# Patient Record
Sex: Male | Born: 1985 | Race: White | Hispanic: No | Marital: Married | Smoking: Never smoker
Health system: Southern US, Community
[De-identification: ages and names within clinical notes are randomized; demographics above are authoritative.]

## PROBLEM LIST (undated history)

## (undated) DIAGNOSIS — B019 Varicella without complication: Secondary | ICD-10-CM

## (undated) HISTORY — DX: Varicella without complication: B01.9

---
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
## 2006-03-30 ENCOUNTER — Emergency Department (HOSPITAL_COMMUNITY): Admission: EM | Admit: 2006-03-30 | Discharge: 2006-03-30 | Payer: Self-pay | Admitting: Emergency Medicine

## 2006-09-06 ENCOUNTER — Emergency Department (HOSPITAL_COMMUNITY): Admission: EM | Admit: 2006-09-06 | Discharge: 2006-09-06 | Payer: Self-pay | Admitting: Emergency Medicine

## 2007-09-13 ENCOUNTER — Emergency Department (HOSPITAL_COMMUNITY): Admission: EM | Admit: 2007-09-13 | Discharge: 2007-09-13 | Payer: Self-pay | Admitting: Emergency Medicine

## 2008-02-11 ENCOUNTER — Emergency Department (HOSPITAL_COMMUNITY): Admission: EM | Admit: 2008-02-11 | Discharge: 2008-02-11 | Payer: Self-pay | Admitting: Emergency Medicine

## 2009-11-25 IMAGING — CR DG ANKLE COMPLETE 3+V*L*
3 series · 3 of 3 positions shown · non-contrast
Comparison: No priors

CLINICAL DATA: Twisted ankle - pain and swelling

LEFT ANKLE COMPLETE - 3+ VIEW

[t ankle joint ap left]
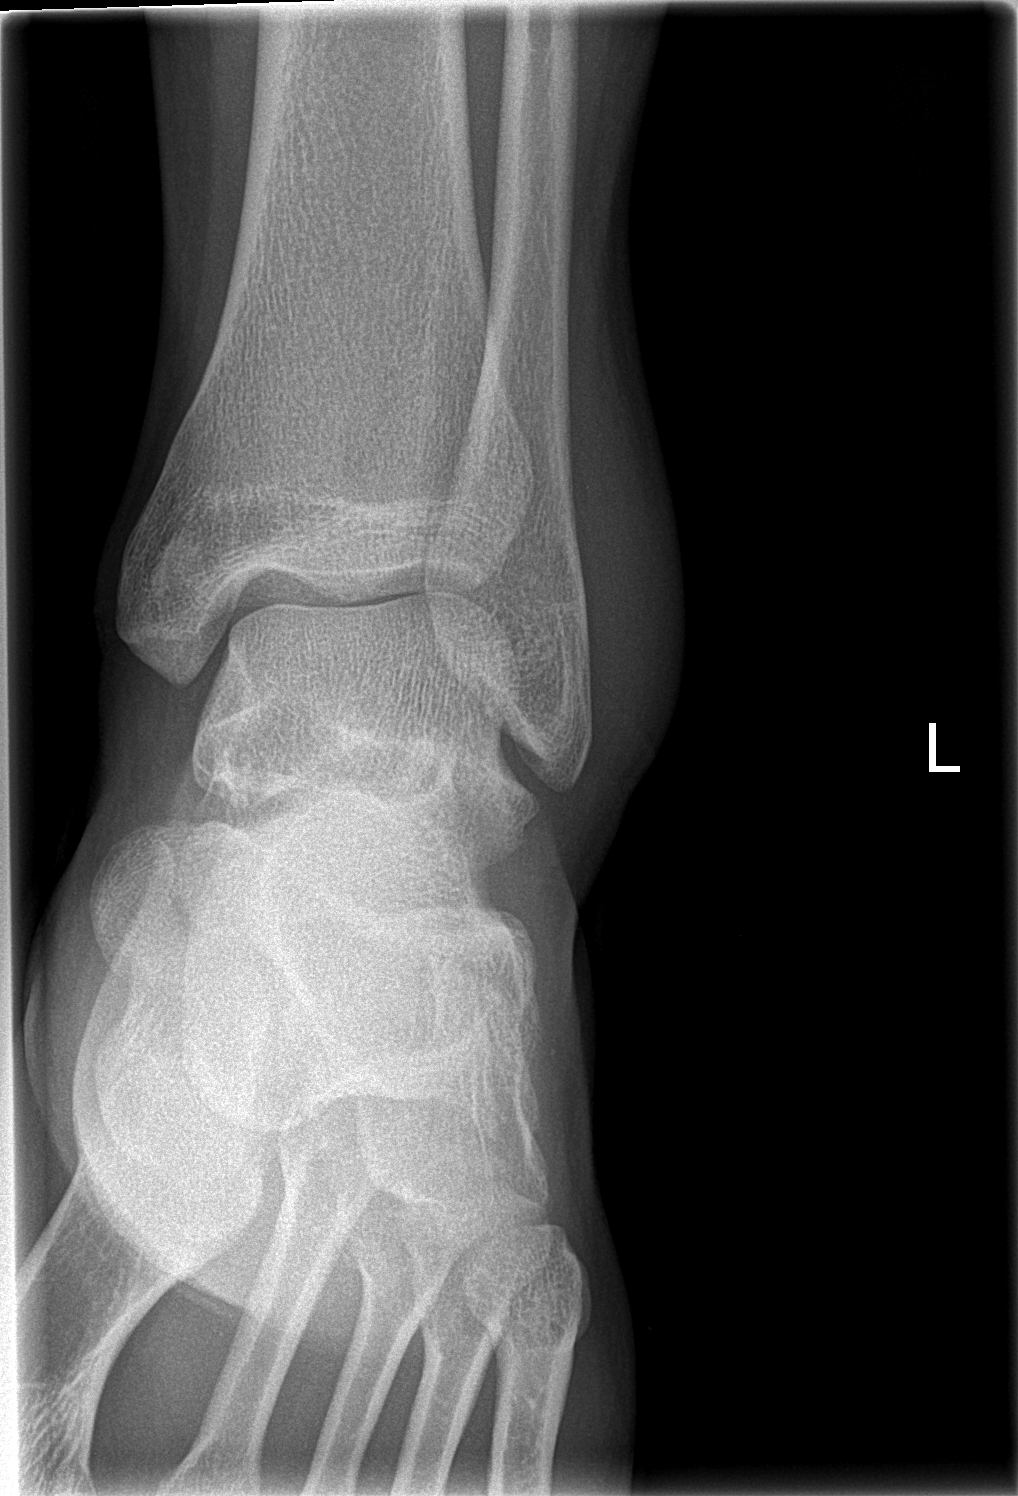

[t ankle joint oblique left]
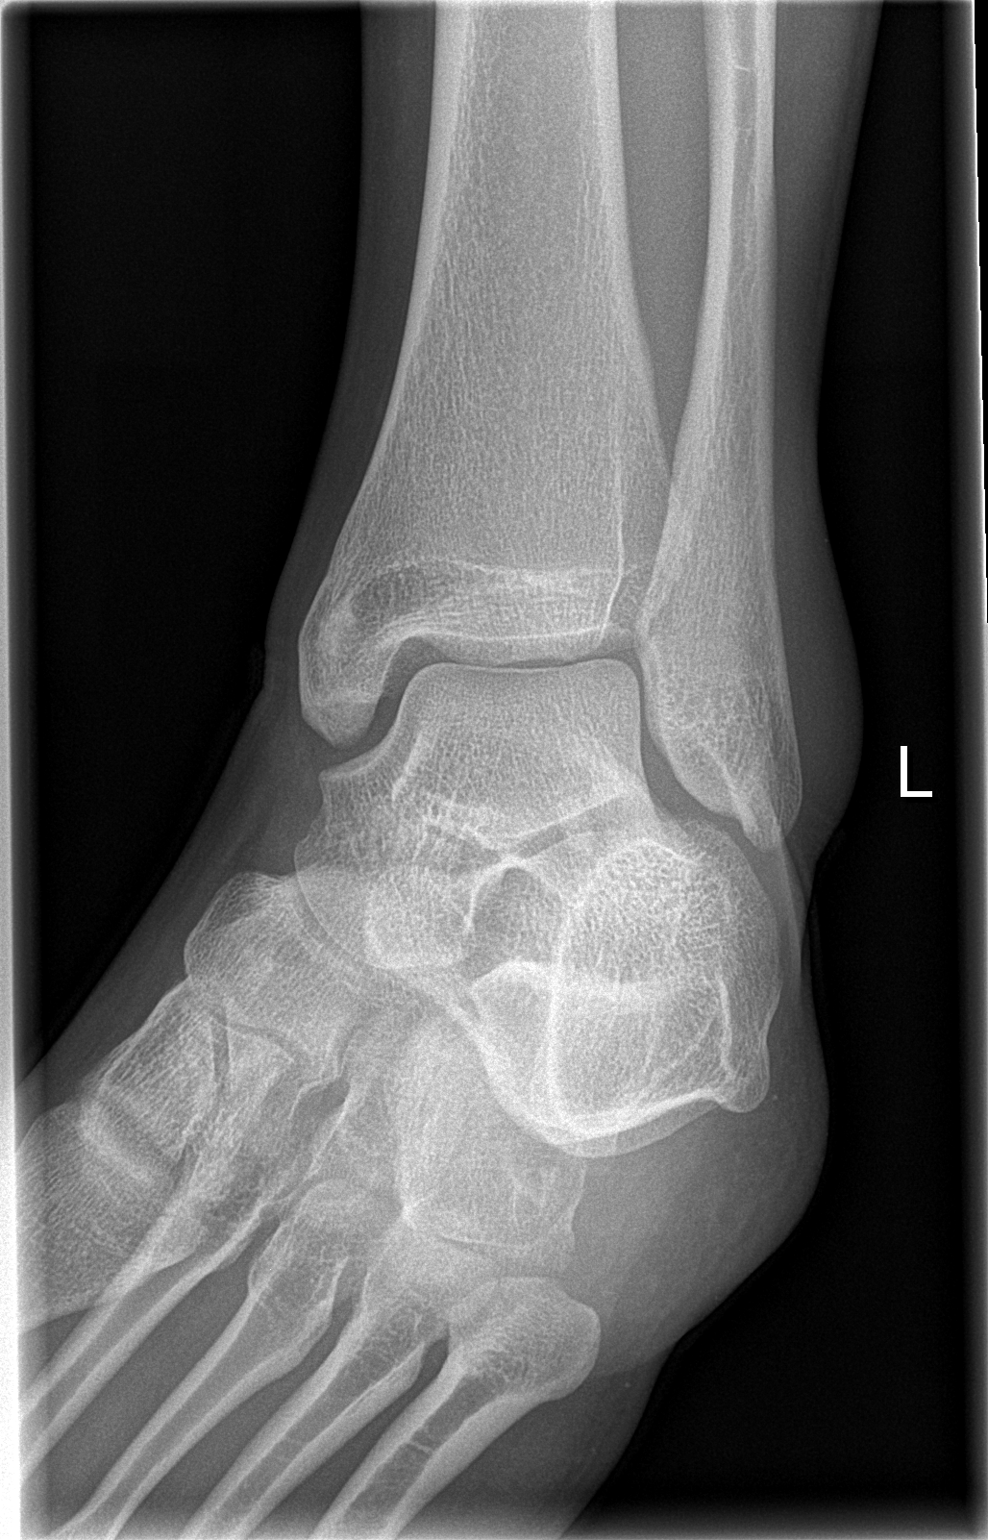

[t ankle joint lat left]
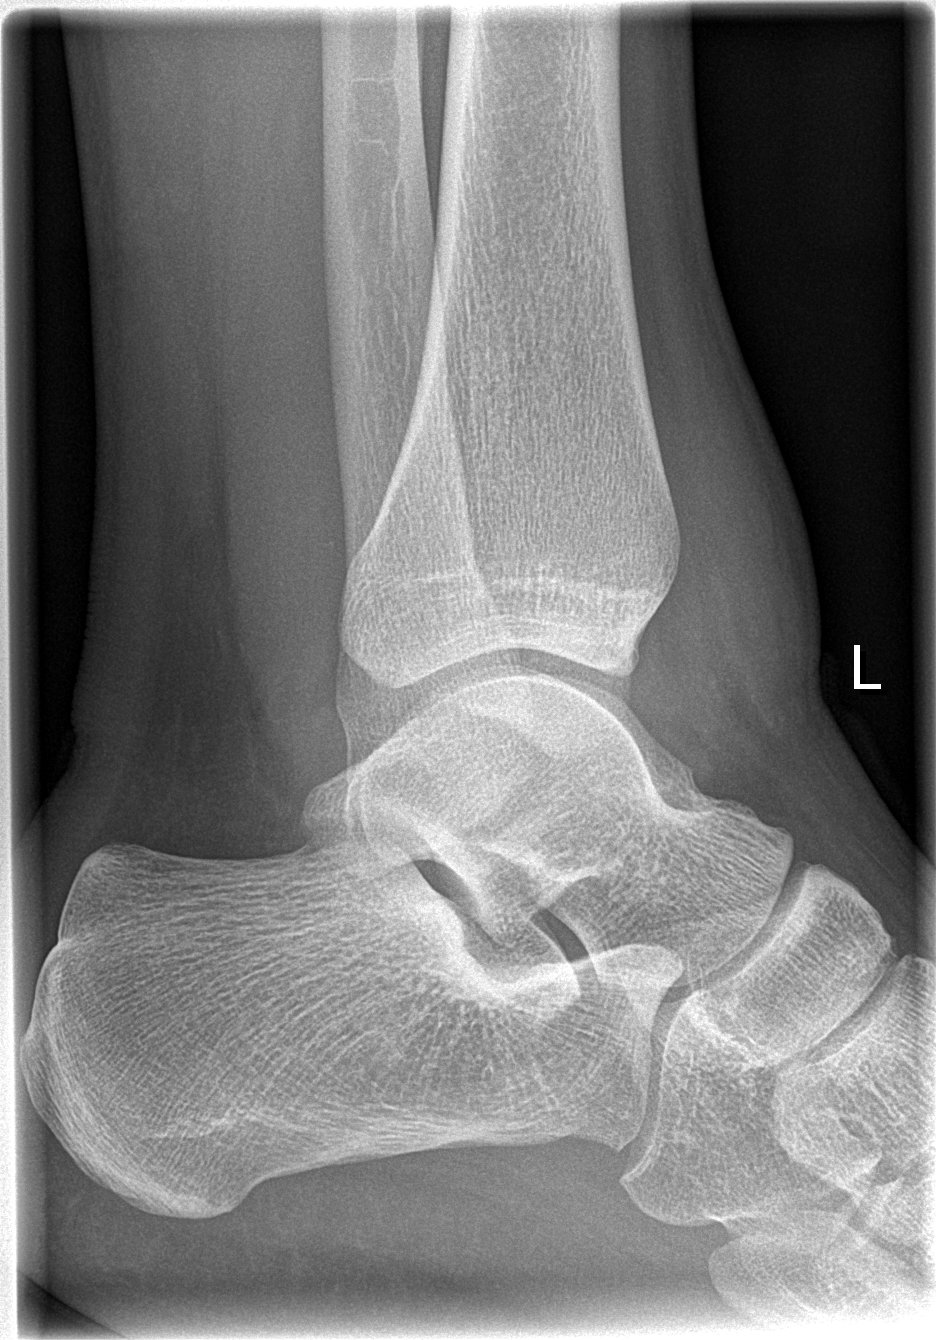

[3 of 3 positions shown; findings below may reference images not displayed]

FINDINGS: There is considerable swelling laterally and anteriorly.
There is no visible fracture or dislocation.
IMPRESSION: Soft tissue swelling antero- laterally.  No fracture or
dislocation.

## 2010-04-17 ENCOUNTER — Emergency Department (HOSPITAL_COMMUNITY): Admission: EM | Admit: 2010-04-17 | Discharge: 2010-04-17 | Payer: Self-pay | Admitting: Emergency Medicine

## 2017-07-17 ENCOUNTER — Ambulatory Visit (INDEPENDENT_AMBULATORY_CARE_PROVIDER_SITE_OTHER): Payer: BLUE CROSS/BLUE SHIELD | Admitting: Family Medicine

## 2017-07-17 ENCOUNTER — Encounter: Payer: Self-pay | Admitting: Family Medicine

## 2017-07-17 DIAGNOSIS — M25562 Pain in left knee: Secondary | ICD-10-CM | POA: Diagnosis not present

## 2017-07-17 DIAGNOSIS — G8929 Other chronic pain: Secondary | ICD-10-CM | POA: Diagnosis not present

## 2017-07-17 NOTE — Assessment & Plan Note (Signed)
Likely secondary to degenerative changes with possible meniscal tear.  His exam today is relatively stable.  Advised continued conservative management.  Recommended compression, ice, and OTC NSAIDs for flareups.  Discussed home exercise program. Pt deferred plain films today. Return precautions reviewed. Follow up as needed.

## 2017-07-17 NOTE — Progress Notes (Signed)
   Subjective:  Steven SkainsBrian M Vaughan is a 32 y.o. male who presents today with a chief complaint of left knee pain and to establish care.   HPI:  Left Knee Pain, New Problem Started about a year ago.  Pain is intermittent nature.  Associated with swelling.  Episodes will occur randomly and last for a few days to week and then subside.  During the episodes, patient uses ice and ibuprofen which helps significant only.  Does not have any clear precipitating events.  His knee will frequently pop during and after the episodes.  He played basketball while in high school and severely injured his left ankle.  Denies any serious injury to his left knee.  No weakness or numbness.  No fevers or chills.  ROS: Per HPI, otherwise a complete review of systems was negative.   PMH:  The following were reviewed and entered/updated in epic: Past Medical History:  Diagnosis Date  . Chicken pox    Patient Active Problem List   Diagnosis Date Noted  . Left knee pain 07/17/2017   History reviewed. No pertinent surgical history.  Family History  Problem Relation Age of Onset  . Gout Neg Hx     Medications- reviewed and updated No current outpatient medications on file.   No current facility-administered medications for this visit.    Allergies-reviewed and updated No Known Allergies  Social History   Socioeconomic History  . Marital status: Married    Spouse name: None  . Number of children: 3  . Years of education: None  . Highest education level: None  Social Needs  . Financial resource strain: None  . Food insecurity - worry: None  . Food insecurity - inability: None  . Transportation needs - medical: None  . Transportation needs - non-medical: None  Occupational History  . None  Tobacco Use  . Smoking status: Never Smoker  . Smokeless tobacco: Never Used  Substance and Sexual Activity  . Alcohol use: No    Frequency: Never  . Drug use: No  . Sexual activity: Yes    Partners:  Female  Other Topics Concern  . None  Social History Narrative  . None   Objective:  Physical Exam: BP 124/82 (BP Location: Left Arm, Patient Position: Sitting, Cuff Size: Normal)   Pulse 79   Temp 98.3 F (36.8 C) (Oral)   Ht 5\' 7"  (1.702 m)   Wt 196 lb 6.4 oz (89.1 kg)   SpO2 97%   BMI 30.76 kg/m   Gen: NAD, resting comfortably CV: RRR with no murmurs appreciated Pulm: NWOB, CTAB with no crackles, wheezes, or rhonchi GI: Normal bowel sounds present. Soft, Nontender, Nondistended. MSK: -Left knee: No deformities.  Nontender to palpation.  Full range of motion.  Stable to varus and valgus stress.  Anterior and posterior drawer signs negative.  Palpable click with McMurray test.  Neurovascularly intact distally. Skin: Warm, dry Neuro: Grossly normal, moves all extremities Psych: Normal affect and thought content  Assessment/Plan:  Left knee pain Likely secondary to degenerative changes with possible meniscal tear.  His exam today is relatively stable.  Advised continued conservative management.  Recommended compression, ice, and OTC NSAIDs for flareups.  Discussed home exercise program. Pt deferred plain films today. Return precautions reviewed. Follow up as needed.   Preventative Healthcare Pt will return soon for CPE.   Katina Degreealeb M. Jimmey RalphParker, MD 07/17/2017 10:00 AM

## 2017-07-17 NOTE — Patient Instructions (Signed)
No changes today.  I think you have wear and tear in your knee. Please use ice, compression, and anti-inflammatories as needed.  Come back soon for your full physical.  Take care, Dr Jimmey RalphParker

## 2017-07-30 ENCOUNTER — Ambulatory Visit (INDEPENDENT_AMBULATORY_CARE_PROVIDER_SITE_OTHER): Payer: BLUE CROSS/BLUE SHIELD | Admitting: Family Medicine

## 2017-07-30 ENCOUNTER — Encounter: Payer: Self-pay | Admitting: Family Medicine

## 2017-07-30 VITALS — BP 126/80 | HR 73 | Temp 98.1°F | Ht 67.0 in | Wt 202.0 lb

## 2017-07-30 DIAGNOSIS — M25562 Pain in left knee: Secondary | ICD-10-CM

## 2017-07-30 DIAGNOSIS — Z13220 Encounter for screening for lipoid disorders: Secondary | ICD-10-CM | POA: Diagnosis not present

## 2017-07-30 DIAGNOSIS — Z0001 Encounter for general adult medical examination with abnormal findings: Secondary | ICD-10-CM

## 2017-07-30 DIAGNOSIS — Z114 Encounter for screening for human immunodeficiency virus [HIV]: Secondary | ICD-10-CM | POA: Diagnosis not present

## 2017-07-30 DIAGNOSIS — G8929 Other chronic pain: Secondary | ICD-10-CM | POA: Diagnosis not present

## 2017-07-30 LAB — COMPREHENSIVE METABOLIC PANEL
ALT: 37 U/L (ref 0–53)
AST: 23 U/L (ref 0–37)
Albumin: 4.3 g/dL (ref 3.5–5.2)
Alkaline Phosphatase: 75 U/L (ref 39–117)
BUN: 13 mg/dL (ref 6–23)
CO2: 28 mEq/L (ref 19–32)
Calcium: 9.7 mg/dL (ref 8.4–10.5)
Chloride: 106 mEq/L (ref 96–112)
Creatinine, Ser: 0.99 mg/dL (ref 0.40–1.50)
GFR: 92.04 mL/min (ref >60.00)
Glucose, Bld: 73 mg/dL (ref 70–99)
Potassium: 3.9 mEq/L (ref 3.5–5.1)
Sodium: 140 mEq/L (ref 135–145)
Total Bilirubin: 0.3 mg/dL (ref 0.2–1.2)
Total Protein: 6.9 g/dL (ref 6.0–8.3)

## 2017-07-30 LAB — LIPID PANEL
Cholesterol: 129 mg/dL (ref 0–200)
HDL: 43.3 mg/dL (ref >39.00)
LDL Cholesterol: 52 mg/dL (ref 0–99)
NonHDL: 86.13
Total CHOL/HDL Ratio: 3
Triglycerides: 173 mg/dL — ABNORMAL HIGH (ref 0.0–149.0)
VLDL: 34.6 mg/dL (ref 0.0–40.0)

## 2017-07-30 LAB — CBC
HCT: 47.7 % (ref 39.0–52.0)
Hemoglobin: 16 g/dL (ref 13.0–17.0)
MCHC: 33.5 g/dL (ref 30.0–36.0)
MCV: 89.3 fl (ref 78.0–100.0)
Platelets: 185 10*3/uL (ref 150.0–400.0)
RBC: 5.35 Mil/uL (ref 4.22–5.81)
RDW: 14.3 % (ref 11.5–15.5)
WBC: 7.1 10*3/uL (ref 4.0–10.5)

## 2017-07-30 NOTE — Progress Notes (Signed)
Subjective:  Steven Vaughan is a 32 y.o. male who presents today for his annual comprehensive physical exam.    HPI:  He has no acute complaints today.   Lifestyle Diet: No specific diets.  Exercise: No specific exercises.   Depression screen PHQ 2/9 07/17/2017  Decreased Interest 0  Down, Depressed, Hopeless 0  PHQ - 2 Score 0   Health Maintenance Due  Topic Date Due  . HIV Screening  09/11/1998  . TETANUS/TDAP  09/11/2002    ROS: A complete review of systems was negative.   PMH:  The following were reviewed and entered/updated in epic: Past Medical History:  Diagnosis Date  . Chicken pox    Patient Active Problem List   Diagnosis Date Noted  . Left knee pain 07/17/2017   History reviewed. No pertinent surgical history.  Family History  Problem Relation Age of Onset  . Gout Neg Hx     Medications- reviewed and updated No current outpatient medications on file.   No current facility-administered medications for this visit.     Allergies-reviewed and updated No Known Allergies  Social History   Socioeconomic History  . Marital status: Married    Spouse name: None  . Number of children: 3  . Years of education: None  . Highest education level: None  Social Needs  . Financial resource strain: None  . Food insecurity - worry: None  . Food insecurity - inability: None  . Transportation needs - medical: None  . Transportation needs - non-medical: None  Occupational History  . None  Tobacco Use  . Smoking status: Never Smoker  . Smokeless tobacco: Never Used  Substance and Sexual Activity  . Alcohol use: No    Frequency: Never  . Drug use: No  . Sexual activity: Yes    Partners: Female  Other Topics Concern  . None  Social History Narrative  . None    Objective:  Physical Exam: BP 126/80 (BP Location: Left Arm, Patient Position: Sitting, Cuff Size: Normal)   Pulse 73   Temp 98.1 F (36.7 C) (Oral)   Ht 5\' 7"  (1.702 m)   Wt 202 lb  (91.6 kg)   SpO2 96%   BMI 31.64 kg/m   Body mass index is 31.64 kg/m. Wt Readings from Last 3 Encounters:  07/30/17 202 lb (91.6 kg)  07/17/17 196 lb 6.4 oz (89.1 kg)   Gen: NAD, resting comfortably HEENT: TMs normal bilaterally. OP clear. No thyromegaly noted.  CV: RRR with no murmurs appreciated Pulm: NWOB, CTAB with no crackles, wheezes, or rhonchi GI: Normal bowel sounds present. Soft, Nontender, Nondistended. MSK: no edema, cyanosis, or clubbing noted Skin: warm, dry Neuro: CN2-12 grossly intact. Strength 5/5 in upper and lower extremities. Reflexes symmetric and intact bilaterally.  Psych: Normal affect and thought content  Assessment/Plan:  Chronic Knee Pain Stable.  Discussed supportive care. Recommended compression, ice, and NSAIDs as needed.   Preventative Healthcare: Check lipid panel and HIV.   Patient Counseling:  -Nutrition: Stressed importance of moderation in sodium/caffeine intake, saturated fat and cholesterol, caloric balance, sufficient intake of fresh fruits, vegetables, and fiber.  -Stressed the importance of regular exercise.   -Substance Abuse: Discussed cessation/primary prevention of tobacco, alcohol, or other drug use; driving or other dangerous activities under the influence; availability of treatment for abuse.   -Injury prevention: Discussed safety belts, safety helmets, smoke detector, smoking near bedding or upholstery.   -Sexuality: Discussed sexually transmitted diseases, partner selection, use of condoms,  avoidance of unintended pregnancy and contraceptive alternatives.   -Dental health: Discussed importance of regular tooth brushing, flossing, and dental visits.  -Health maintenance and immunizations reviewed. Please refer to Health maintenance section.  Return to care in 1 year for next preventative visit.   Katina Degreealeb M. Jimmey RalphParker, MD 07/30/2017 8:32 AM

## 2017-07-30 NOTE — Patient Instructions (Signed)
Preventive Care 18-39 Years, Male Preventive care refers to lifestyle choices and visits with your health care provider that can promote health and wellness. What does preventive care include?  A yearly physical exam. This is also called an annual well check.  Dental exams once or twice a year.  Routine eye exams. Ask your health care provider how often you should have your eyes checked.  Personal lifestyle choices, including: ? Daily care of your teeth and gums. ? Regular physical activity. ? Eating a healthy diet. ? Avoiding tobacco and drug use. ? Limiting alcohol use. ? Practicing safe sex. What happens during an annual well check? The services and screenings done by your health care provider during your annual well check will depend on your age, overall health, lifestyle risk factors, and family history of disease. Counseling Your health care provider may ask you questions about your:  Alcohol use.  Tobacco use.  Drug use.  Emotional well-being.  Home and relationship well-being.  Sexual activity.  Eating habits.  Work and work Statistician.  Screening You may have the following tests or measurements:  Height, weight, and BMI.  Blood pressure.  Lipid and cholesterol levels. These may be checked every 5 years starting at age 34.  Diabetes screening. This is done by checking your blood sugar (glucose) after you have not eaten for a while (fasting).  Skin check.  Hepatitis C blood test.  Hepatitis B blood test.  Sexually transmitted disease (STD) testing.  Discuss your test results, treatment options, and if necessary, the need for more tests with your health care provider. Vaccines Your health care provider may recommend certain vaccines, such as:  Influenza vaccine. This is recommended every year.  Tetanus, diphtheria, and acellular pertussis (Tdap, Td) vaccine. You may need a Td booster every 10 years.  Varicella vaccine. You may need this if you  have not been vaccinated.  HPV vaccine. If you are 23 or younger, you may need three doses over 6 months.  Measles, mumps, and rubella (MMR) vaccine. You may need at least one dose of MMR.You may also need a second dose.  Pneumococcal 13-valent conjugate (PCV13) vaccine. You may need this if you have certain conditions and have not been vaccinated.  Pneumococcal polysaccharide (PPSV23) vaccine. You may need one or two doses if you smoke cigarettes or if you have certain conditions.  Meningococcal vaccine. One dose is recommended if you are age 65-21 years and a first-year college student living in a residence hall, or if you have one of several medical conditions. You may also need additional booster doses.  Hepatitis A vaccine. You may need this if you have certain conditions or if you travel or work in places where you may be exposed to hepatitis A.  Hepatitis B vaccine. You may need this if you have certain conditions or if you travel or work in places where you may be exposed to hepatitis B.  Haemophilus influenzae type b (Hib) vaccine. You may need this if you have certain risk factors.  Talk to your health care provider about which screenings and vaccines you need and how often you need them. This information is not intended to replace advice given to you by your health care provider. Make sure you discuss any questions you have with your health care provider. Document Released: 07/02/2001 Document Revised: 01/24/2016 Document Reviewed: 03/07/2015 Elsevier Interactive Patient Education  [employer].

## 2017-07-31 LAB — HIV ANTIBODY (ROUTINE TESTING W REFLEX): HIV 1&2 Ab, 4th Generation: NONREACTIVE

## 2017-08-04 NOTE — Progress Notes (Signed)
Dr Lavone NeriParker's interpretation of your lab work:  Your blood counts are normal.   Your electrolytes, kidney function, and liver function are all normal. Your HIV test is normal. Your triglycerides are a little high, but the rest of your cholesterol panel is normal. We do not need to make any changes to your treatment plan at this time. Continue working on diet and exercise and we can recheck next year.   If you have any additional questions, please give us a call or send us a message through [facility].  Take care, Dr Jimmey RalphParker

## 2018-09-02 ENCOUNTER — Telehealth: Payer: Self-pay | Admitting: Family Medicine

## 2018-09-02 NOTE — Telephone Encounter (Signed)
Pt has not been seen in over a year. Left vm for pt to schedule °

## 2018-10-13 ENCOUNTER — Encounter: Payer: Self-pay | Admitting: Family Medicine

## 2018-10-13 ENCOUNTER — Other Ambulatory Visit: Payer: Self-pay

## 2018-10-13 ENCOUNTER — Ambulatory Visit (INDEPENDENT_AMBULATORY_CARE_PROVIDER_SITE_OTHER): Payer: BLUE CROSS/BLUE SHIELD | Admitting: Family Medicine

## 2018-10-13 VITALS — BP 122/82 | HR 72 | Temp 98.4°F | Ht 68.0 in | Wt 207.2 lb

## 2018-10-13 DIAGNOSIS — Z6831 Body mass index (BMI) 31.0-31.9, adult: Secondary | ICD-10-CM | POA: Diagnosis not present

## 2018-10-13 DIAGNOSIS — E781 Pure hyperglyceridemia: Secondary | ICD-10-CM | POA: Diagnosis not present

## 2018-10-13 DIAGNOSIS — Z0001 Encounter for general adult medical examination with abnormal findings: Secondary | ICD-10-CM

## 2018-10-13 LAB — LIPID PANEL
Cholesterol: 123 mg/dL (ref 0–200)
HDL: 38.1 mg/dL — ABNORMAL LOW (ref >39.00)
LDL Cholesterol: 61 mg/dL (ref 0–99)
NonHDL: 84.58
Total CHOL/HDL Ratio: 3
Triglycerides: 116 mg/dL (ref 0.0–149.0)
VLDL: 23.2 mg/dL (ref 0.0–40.0)

## 2018-10-13 LAB — COMPREHENSIVE METABOLIC PANEL
ALT: 33 U/L (ref 0–53)
AST: 23 U/L (ref 0–37)
Albumin: 4.4 g/dL (ref 3.5–5.2)
Alkaline Phosphatase: 72 U/L (ref 39–117)
BUN: 19 mg/dL (ref 6–23)
CO2: 30 mEq/L (ref 19–32)
Calcium: 9.6 mg/dL (ref 8.4–10.5)
Chloride: 105 mEq/L (ref 96–112)
Creatinine, Ser: 1.09 mg/dL (ref 0.40–1.50)
GFR: 76.95 mL/min (ref >60.00)
Glucose, Bld: 90 mg/dL (ref 70–99)
Potassium: 4.6 mEq/L (ref 3.5–5.1)
Sodium: 141 mEq/L (ref 135–145)
Total Bilirubin: 0.3 mg/dL (ref 0.2–1.2)
Total Protein: 6.4 g/dL (ref 6.0–8.3)

## 2018-10-13 LAB — CBC
HCT: 44.4 % (ref 39.0–52.0)
Hemoglobin: 15.3 g/dL (ref 13.0–17.0)
MCHC: 34.5 g/dL (ref 30.0–36.0)
MCV: 88.4 fl (ref 78.0–100.0)
Platelets: 180 10*3/uL (ref 150.0–400.0)
RBC: 5.03 Mil/uL (ref 4.22–5.81)
RDW: 14.3 % (ref 11.5–15.5)
WBC: 6.4 10*3/uL (ref 4.0–10.5)

## 2018-10-13 LAB — TSH: TSH: 1.46 u[IU]/mL (ref 0.35–4.50)

## 2018-10-13 NOTE — Patient Instructions (Signed)
It was very nice to see you today!    Eat at least 3 REAL meals and 1-2 snacks per day.  Aim for no more than 5 hours between eating.  Eat breakfast within one hour of getting up.    Obtain twice as many fruits/vegetables as protein or carbohydrate foods for both lunch and dinner.   Cut down on sweet beverages. This includes juice, soda, and sweet tea.    Exercise at least 150 minutes every week.   We will check labs today. Come back in 1 year for you rnext physical.   Take care, Dr Jerline Pain   Preventive Care 18-39 Years, Male Preventive care refers to lifestyle choices and visits with your health care provider that can promote health and wellness. What does preventive care include?   A yearly physical exam. This is also called an annual well check.  Dental exams once or twice a year.  Routine eye exams. Ask your health care provider how often you should have your eyes checked.  Personal lifestyle choices, including: ? Daily care of your teeth and gums. ? Regular physical activity. ? Eating a healthy diet. ? Avoiding tobacco and drug use. ? Limiting alcohol use. ? Practicing safe sex. What happens during an annual well check? The services and screenings done by your health care provider during your annual well check will depend on your age, overall health, lifestyle risk factors, and family history of disease. Counseling Your health care provider may ask you questions about your:  Alcohol use.  Tobacco use.  Drug use.  Emotional well-being.  Home and relationship well-being.  Sexual activity.  Eating habits.  Work and work Statistician. Screening You may have the following tests or measurements:  Height, weight, and BMI.  Blood pressure.  Lipid and cholesterol levels. These may be checked every 5 years starting at age 64.  Diabetes screening. This is done by checking your blood sugar (glucose) after you have not eaten for a while (fasting).  Skin  check.  Hepatitis C blood test.  Hepatitis B blood test.  Sexually transmitted disease (STD) testing. Discuss your test results, treatment options, and if necessary, the need for more tests with your health care provider. Vaccines Your health care provider may recommend certain vaccines, such as:  Influenza vaccine. This is recommended every year.  Tetanus, diphtheria, and acellular pertussis (Tdap, Td) vaccine. You may need a Td booster every 10 years.  Varicella vaccine. You may need this if you have not been vaccinated.  HPV vaccine. If you are 61 or younger, you may need three doses over 6 months.  Measles, mumps, and rubella (MMR) vaccine. You may need at least one dose of MMR.You may also need a second dose.  Pneumococcal 13-valent conjugate (PCV13) vaccine. You may need this if you have certain conditions and have not been vaccinated.  Pneumococcal polysaccharide (PPSV23) vaccine. You may need one or two doses if you smoke cigarettes or if you have certain conditions.  Meningococcal vaccine. One dose is recommended if you are age 100-21 years and a first-year college student living in a residence hall, or if you have one of several medical conditions. You may also need additional booster doses.  Hepatitis A vaccine. You may need this if you have certain conditions or if you travel or work in places where you may be exposed to hepatitis A.  Hepatitis B vaccine. You may need this if you have certain conditions or if you travel or work in places  where you may be exposed to hepatitis B.  Haemophilus influenzae type b (Hib) vaccine. You may need this if you have certain risk factors. Talk to your health care provider about which screenings and vaccines you need and how often you need them. This information is not intended to replace advice given to you by your health care provider. Make sure you discuss any questions you have with your health care provider. Document Released:  07/02/2001 Document Revised: 12/17/2016 Document Reviewed: 03/07/2015 Elsevier Interactive Patient Education  2019 [employer].

## 2018-10-13 NOTE — Progress Notes (Signed)
Please inform patient of the following:  His "good" cholesterol is a bit low but everything else looks good. Recommend that he continue with diet and exercise and we can recheck in a year or so.  Katina Degree. [name], MD 10/13/2018 1:25 PM

## 2018-10-13 NOTE — Assessment & Plan Note (Signed)
Check lipid panel, CBC, CMET, and TSH. Discussed lifestyle modifications.

## 2018-10-13 NOTE — Progress Notes (Signed)
Chief Complaint:  Steven SkainsBrian M Zuccaro is a 33 y.o. male who presents today for his annual comprehensive physical exam.    Assessment/Plan:  Hypertriglyceridemia Check lipid panel, CBC, CMET, and TSH. Discussed lifestyle modifications.   BMI 31 Discussed lifestyle modifications.   Preventative Healthcare: Check lipid panel, CBC, CMET, and TSH.   Patient Counseling(The following topics were reviewed and/or handout was given):  -Nutrition: Stressed importance of moderation in sodium/caffeine intake, saturated fat and cholesterol, caloric balance, sufficient intake of fresh fruits, vegetables, and fiber.  -Stressed the importance of regular exercise.   -Substance Abuse: Discussed cessation/primary prevention of tobacco, alcohol, or other drug use; driving or other dangerous activities under the influence; availability of treatment for abuse.   -Injury prevention: Discussed safety belts, safety helmets, smoke detector, smoking near bedding or upholstery.   -Sexuality: Discussed sexually transmitted diseases, partner selection, use of condoms, avoidance of unintended pregnancy and contraceptive alternatives.   -Dental health: Discussed importance of regular tooth brushing, flossing, and dental visits.  -Health maintenance and immunizations reviewed. Please refer to Health maintenance section.  Return to care in 1 year for next preventative visit.     Subjective:  HPI:  He has no acute complaints today.   Lifestyle Diet: No specific diets. Trying to reduce calories.  Exercise: No specific exercises. Tries to stay active at work.   Depression screen PHQ 2/9 10/13/2018  Decreased Interest 0  Down, Depressed, Hopeless 0  PHQ - 2 Score 0   There are no preventive care reminders to display for this patient.   ROS: Per HPI, otherwise a complete review of systems was negative.   PMH:  The following were reviewed and entered/updated in epic: Past Medical History:  Diagnosis Date  .  Chicken pox    Patient Active Problem List   Diagnosis Date Noted  . Hypertriglyceridemia 10/13/2018  . Left knee pain 07/17/2017   History reviewed. No pertinent surgical history.  Family History  Problem Relation Age of Onset  . Gout Neg Hx     Medications- reviewed and updated No current outpatient medications on file.   No current facility-administered medications for this visit.     Allergies-reviewed and updated No Known Allergies  Social History   Socioeconomic History  . Marital status: Married    Spouse name: Not on file  . Number of children: 3  . Years of education: Not on file  . Highest education level: Not on file  Occupational History  . Not on file  Social Needs  . Financial resource strain: Not on file  . Food insecurity:    Worry: Not on file    Inability: Not on file  . Transportation needs:    Medical: Not on file    Non-medical: Not on file  Tobacco Use  . Smoking status: Never Smoker  . Smokeless tobacco: Never Used  Substance and Sexual Activity  . Alcohol use: No    Frequency: Never  . Drug use: No  . Sexual activity: Yes    Partners: Female  Lifestyle  . Physical activity:    Days per week: Not on file    Minutes per session: Not on file  . Stress: Not on file  Relationships  . Social connections:    Talks on phone: Not on file    Gets together: Not on file    Attends religious service: Not on file    Active member of club or organization: Not on file    Attends  meetings of clubs or organizations: Not on file    Relationship status: Not on file  Other Topics Concern  . Not on file  Social History Narrative  . Not on file        Objective:  Physical Exam: BP 122/82 (BP Location: Left Arm, Patient Position: Sitting, Cuff Size: Normal)   Pulse 72   Temp 98.4 F (36.9 C) (Oral)   Ht 5\' 8"  (1.727 m)   Wt 207 lb 3.2 oz (94 kg)   SpO2 97%   BMI 31.50 kg/m   Body mass index is 31.5 kg/m. Wt Readings from Last 3  Encounters:  10/13/18 207 lb 3.2 oz (94 kg)  07/30/17 202 lb (91.6 kg)  07/17/17 196 lb 6.4 oz (89.1 kg)   Gen: NAD, resting comfortably HEENT: TMs normal bilaterally. OP clear. No thyromegaly noted.  CV: RRR with no murmurs appreciated Pulm: NWOB, CTAB with no crackles, wheezes, or rhonchi GI: Normal bowel sounds present. Soft, Nontender, Nondistended. MSK: no edema, cyanosis, or clubbing noted Skin: warm, dry Neuro: CN2-12 grossly intact. Strength 5/5 in upper and lower extremities. Reflexes symmetric and intact bilaterally.  Psych: Normal affect and thought content     Caleb M. [name], MD 10/13/2018 8:58 AM

## 2019-10-08 ENCOUNTER — Other Ambulatory Visit: Payer: Self-pay

## 2019-10-08 ENCOUNTER — Ambulatory Visit: Payer: Managed Care, Other (non HMO) | Admitting: Family Medicine

## 2019-10-08 ENCOUNTER — Encounter: Payer: Self-pay | Admitting: Family Medicine

## 2019-10-08 VITALS — BP 102/78 | HR 73 | Temp 98.4°F | Ht 68.0 in | Wt 202.8 lb

## 2019-10-08 DIAGNOSIS — M25571 Pain in right ankle and joints of right foot: Secondary | ICD-10-CM | POA: Diagnosis not present

## 2019-10-08 MED ORDER — DICLOFENAC SODIUM 75 MG PO TBEC: 75.0000 mg | DELAYED_RELEASE_TABLET | Freq: Two times a day (BID) | ORAL | 0 refills | Status: DC

## 2019-10-08 NOTE — Patient Instructions (Signed)
It was very nice to see you today!  I am worried that you may have strained your achiles tendon.   Please wear the boot as much as you can. I will send you to sports medicine to make sure that it is not torn.  Please use the diclofenac twice daily.  Take care, Dr [name]  Please try these tips to maintain a healthy lifestyle:   Eat at least 3 REAL meals and 1-2 snacks per day.  Aim for no more than 5 hours between eating.  If you eat breakfast, please do so within one hour of getting up.    Each meal should contain half fruits/vegetables, one quarter protein, and one quarter carbs (no bigger than a computer mouse)   Cut down on sweet beverages. This includes juice, soda, and sweet tea.     Drink at least 1 glass of water with each meal and aim for at least 8 glasses per day   Exercise at least 150 minutes every week.

## 2019-10-08 NOTE — Progress Notes (Signed)
   Steven Vaughan is a 34 y.o. male who presents today for an office visit.  Assessment/Plan:  New/Acute Problems: Heel Pain Concern for Achilles tear/strain.  Has good strength and normal Thompson test-doubt complete tear.  Recommended minimal weightbearing.  Will place in CAM boot.  Will start diclofenac.  Recommended ice, compression, and elevation.  We will also place referral to sports medicine.    Subjective:  HPI:  Patient with right heel pain since last night.  States he had just played basketball with his daughter.  No obvious injuries what is planning to later on the day had progressively worsening pain.  Is progressed to the point where he is unable to walk on his right heel.  Pain is worse with walking.  Worse with putting pressure on the area.  He has noticed some bruising to the back part of his foot near the heel.  Has tried compression wrap and ibuprofen which seems to help.       Objective:  Physical Exam: BP 102/78 (BP Location: Left Arm, Patient Position: Sitting, Cuff Size: Normal)   Pulse 73   Temp 98.4 F (36.9 C) (Temporal)   Ht 5\' 8"  (1.727 m)   Wt 202 lb 12.8 oz (92 kg)   SpO2 97%   BMI 30.84 kg/m   Gen: No acute distress, resting comfortably MSK: Ecchymosis noted at lateral insertion of Achilles tendon on right calcaneus.  Normal plantar flexion and dorsiflexion.  Normal Thompson test.  Neurovascular intact distally. Psych: Normal affect and thought content      Daphane Odekirk M. , MD 10/08/2019 11:40 AM

## 2019-10-20 ENCOUNTER — Ambulatory Visit (INDEPENDENT_AMBULATORY_CARE_PROVIDER_SITE_OTHER): Payer: Managed Care, Other (non HMO) | Admitting: Family Medicine

## 2019-10-20 DIAGNOSIS — Z5329 Procedure and treatment not carried out because of patient's decision for other reasons: Secondary | ICD-10-CM

## 2019-10-20 NOTE — Progress Notes (Signed)
No show

## 2020-01-05 ENCOUNTER — Encounter: Payer: Managed Care, Other (non HMO) | Admitting: Family Medicine

## 2020-05-17 ENCOUNTER — Other Ambulatory Visit: Payer: Self-pay

## 2020-05-17 ENCOUNTER — Emergency Department (INDEPENDENT_AMBULATORY_CARE_PROVIDER_SITE_OTHER)
Admission: EM | Admit: 2020-05-17 | Discharge: 2020-05-17 | Disposition: A | Discharge status: Home / Self Care | Payer: Managed Care, Other (non HMO) | Source: Home / Self Care | Attending: Family Medicine | Admitting: Family Medicine

## 2020-05-17 DIAGNOSIS — J320 Chronic maxillary sinusitis: Secondary | ICD-10-CM

## 2020-05-17 MED ORDER — AMOXICILLIN 500 MG PO CAPS: 500.0000 mg | ORAL_CAPSULE | Freq: Three times a day (TID) | ORAL | 0 refills | Status: DC

## 2020-05-17 NOTE — ED Triage Notes (Signed)
Pt c/o cough and congestion. Also low grade fever. Started two days ago. No known covid exposure. Has had both covid vaccines.  Also concerned about a nodule he can feel in his middle RT finger. Tender to touch. No known injury. Pain 5/10

## 2020-05-17 NOTE — ED Provider Notes (Addendum)
[name]    CSN: [id number] Arrival date & time: 05/17/20  1637      History   Chief Complaint Chief Complaint  Patient presents with  . Cough  . Nasal Congestion  . Hand Pain    RT middle finger    HPI Steven Vaughan is a 34 y.o. male.  Patient complains of cough congestion.  Cough is productive of greenish sputum has had low-grade fever.  Has had Covid vaccine and had no known exposure. Also has a nodule base of his of his right middle finger.  Has no pain or trigger finger like motion.   HPI  Past Medical History:  Diagnosis Date  . Chicken pox     Patient Active Problem List   Diagnosis Date Noted  . Hypertriglyceridemia 10/13/2018  . Left knee pain 07/17/2017    History reviewed. No pertinent surgical history.     Home Medications    Prior to Admission medications   Medication Sig Start Date End Date Taking? Authorizing Provider  diclofenac (VOLTAREN) 75 MG EC tablet Take 1 tablet (75 mg total) by mouth 2 (two) times daily. 10/08/19   Ardith Dark, MD    Family History Family History  Problem Relation Age of Onset  . Gout Neg Hx     Social History Social History   Tobacco Use  . Smoking status: Never Smoker  . Smokeless tobacco: Never Used  Vaping Use  . Vaping Use: Never used  Substance Use Topics  . Alcohol use: No  . Drug use: No     Allergies   Patient has no known allergies.   Review of Systems Review of Systems  HENT: Positive for congestion, postnasal drip, sinus pressure and sinus pain.   Respiratory: Positive for cough.   All other systems reviewed and are negative.    Physical Exam Triage Vital Signs ED Triage Vitals  Enc Vitals Group     BP 05/17/20 1749 130/84     Pulse Rate 05/17/20 1749 77     Resp 05/17/20 1749 17     Temp 05/17/20 1749 98.3 F (36.8 C)     Temp Source 05/17/20 1749 Oral     SpO2 05/17/20 1749 97 %     Weight --      Height --      Head Circumference --      Peak Flow  --      Pain Score 05/17/20 1750 0     Pain Loc --      Pain Edu? --      Excl. in GC? --    No data found.  Updated Vital Signs BP 130/84 (BP Location: Left Arm)   Pulse 77   Temp 98.3 F (36.8 C) (Oral)   Resp 17   SpO2 97%   Visual Acuity Right Eye Distance:   Left Eye Distance:   Bilateral Distance:    Right Eye Near:   Left Eye Near:    Bilateral Near:     Physical Exam Vitals and nursing note reviewed.  Constitutional:      Appearance: Normal appearance.  HENT:     Head: Normocephalic.     Comments: Sinus tenderness with percussion    Right Ear: Tympanic membrane normal.     Left Ear: Tympanic membrane normal.     Mouth/Throat:     Mouth: Mucous membranes are moist.  Cardiovascular:     Rate and Rhythm: Normal rate and regular rhythm.  Pulmonary:     Effort: Pulmonary effort is normal.     Breath sounds: Normal breath sounds.  Musculoskeletal:     Comments: Nodule on the flexor tendon base of long finger consistent with cyst or Dupuytren's nodule  Neurological:     General: No focal deficit present.     Mental Status: He is alert and oriented to person, place, and time.      UC Treatments / Results  Labs (all labs ordered are listed, but only abnormal results are displayed) Labs Reviewed - No data to display  EKG   Radiology No results found.  Procedures Procedures (including critical care time)  Medications Ordered in UC Medications - No data to display  Initial Impression / Assessment and Plan / UC Course  I have reviewed the triage vital signs and the nursing notes.  Pertinent labs & imaging results that were available during my care of the patient were reviewed by me and considered in my medical decision making (see chart for details).     Probable sinusitis.  Had negative Covid test at home yesterday. Final Clinical Impressions(s) / UC Diagnoses   Final diagnoses:  None   Discharge Instructions   None    ED Prescriptions     None     PDMP not reviewed this encounter.   Frederica Kuster, MD 05/17/20 1819    Frederica Kuster, MD 05/17/20 Zollie Pee

## 2020-06-13 ENCOUNTER — Ambulatory Visit (INDEPENDENT_AMBULATORY_CARE_PROVIDER_SITE_OTHER): Payer: BC Managed Care – PPO | Admitting: Family Medicine

## 2020-06-13 ENCOUNTER — Other Ambulatory Visit: Payer: Self-pay

## 2020-06-13 ENCOUNTER — Encounter: Payer: Self-pay | Admitting: Family Medicine

## 2020-06-13 VITALS — BP 108/72 | HR 75 | Temp 98.6°F | Ht 68.0 in | Wt 210.2 lb

## 2020-06-13 DIAGNOSIS — Z23 Encounter for immunization: Secondary | ICD-10-CM

## 2020-06-13 DIAGNOSIS — E781 Pure hyperglyceridemia: Secondary | ICD-10-CM

## 2020-06-13 DIAGNOSIS — Z6831 Body mass index (BMI) 31.0-31.9, adult: Secondary | ICD-10-CM

## 2020-06-13 DIAGNOSIS — M6790 Unspecified disorder of synovium and tendon, unspecified site: Secondary | ICD-10-CM | POA: Diagnosis not present

## 2020-06-13 DIAGNOSIS — Z0001 Encounter for general adult medical examination with abnormal findings: Secondary | ICD-10-CM

## 2020-06-13 DIAGNOSIS — E669 Obesity, unspecified: Secondary | ICD-10-CM

## 2020-06-13 LAB — LIPID PANEL
Cholesterol: 136 mg/dL (ref 0–200)
HDL: 42.1 mg/dL (ref >39.00)
LDL Cholesterol: 77 mg/dL (ref 0–99)
NonHDL: 94.25
Total CHOL/HDL Ratio: 3
Triglycerides: 86 mg/dL (ref 0.0–149.0)
VLDL: 17.2 mg/dL (ref 0.0–40.0)

## 2020-06-13 LAB — CBC
HCT: 46.2 % (ref 39.0–52.0)
Hemoglobin: 15.7 g/dL (ref 13.0–17.0)
MCHC: 34 g/dL (ref 30.0–36.0)
MCV: 88.1 fl (ref 78.0–100.0)
Platelets: 192 10*3/uL (ref 150.0–400.0)
RBC: 5.24 Mil/uL (ref 4.22–5.81)
RDW: 14.2 % (ref 11.5–15.5)
WBC: 6.4 10*3/uL (ref 4.0–10.5)

## 2020-06-13 LAB — COMPREHENSIVE METABOLIC PANEL
ALT: 29 U/L (ref 0–53)
AST: 23 U/L (ref 0–37)
Albumin: 4.8 g/dL (ref 3.5–5.2)
Alkaline Phosphatase: 75 U/L (ref 39–117)
BUN: 13 mg/dL (ref 6–23)
CO2: 30 mEq/L (ref 19–32)
Calcium: 10 mg/dL (ref 8.4–10.5)
Chloride: 105 mEq/L (ref 96–112)
Creatinine, Ser: 1.09 mg/dL (ref 0.40–1.50)
GFR: 87.16 mL/min (ref >60.00)
Glucose, Bld: 85 mg/dL (ref 70–99)
Potassium: 4.3 mEq/L (ref 3.5–5.1)
Sodium: 140 mEq/L (ref 135–145)
Total Bilirubin: 0.5 mg/dL (ref 0.2–1.2)
Total Protein: 7.4 g/dL (ref 6.0–8.3)

## 2020-06-13 LAB — TSH: TSH: 1.83 u[IU]/mL (ref 0.35–4.50)

## 2020-06-13 NOTE — Patient Instructions (Signed)
It was very nice to see you today!  I will place a referral for you to see a specialist for your hand. You may need a cortisone injection  We will give your tetanus vaccine today.  We will check blood work today.  I will see back in year. Please come back to see me sooner if needed.  Take care, Dr [name]  Please try these tips to maintain a healthy lifestyle:   Eat at least 3 REAL meals and 1-2 snacks per day.  Aim for no more than 5 hours between eating.  If you eat breakfast, please do so within one hour of getting up.    Each meal should contain half fruits/vegetables, one quarter protein, and one quarter carbs (no bigger than a computer mouse)   Cut down on sweet beverages. This includes juice, soda, and sweet tea.     Drink at least 1 glass of water with each meal and aim for at least 8 glasses per day   Exercise at least 150 minutes every week.    Preventive Care 33-17 Years Old, Male Preventive care refers to lifestyle choices and visits with your health care provider that can promote health and wellness. This includes:  A yearly physical exam. This is also called an annual wellness visit.  Regular dental and eye exams.  Immunizations.  Screening for certain conditions.  Healthy lifestyle choices, such as: ? Eating a healthy diet. ? Getting regular exercise. ? Not using drugs or products that contain nicotine and tobacco. ? Limiting alcohol use. What can I expect for my preventive care visit? Physical exam Your health care provider may check your:  Height and weight. These may be used to calculate your BMI (body mass index). BMI is a measurement that tells if you are at a healthy weight.  Heart rate and blood pressure.  Body temperature.  Skin for abnormal spots. Counseling Your health care provider may ask you questions about your:  Past medical problems.  Family's medical history.  Alcohol, tobacco, and drug use.  Emotional  well-being.  Home life and relationship well-being.  Sexual activity.  Diet, exercise, and sleep habits.  Work and work Astronomer.  Access to firearms. What immunizations do I need? Vaccines are usually given at various ages, according to a schedule. Your health care provider will recommend vaccines for you based on your age, medical history, and lifestyle or other factors, such as travel or where you work.   What tests do I need? Blood tests  Lipid and cholesterol levels. These may be checked every 5 years starting at age 45.  Hepatitis C test.  Hepatitis B test. Screening  Diabetes screening. This is done by checking your blood sugar (glucose) after you have not eaten for a while (fasting).  Genital exam to check for testicular cancer or hernias.  STD (sexually transmitted disease) testing, if you are at risk. Talk with your health care provider about your test results, treatment options, and if necessary, the need for more tests.   Follow these instructions at home: Eating and drinking  Eat a healthy diet that includes fresh fruits and vegetables, whole grains, lean protein, and low-fat dairy products.  Drink enough fluid to keep your urine pale yellow.  Take vitamin and mineral supplements as recommended by your health care provider.  Do not drink alcohol if your health care provider tells you not to drink.  If you drink alcohol: ? Limit how much you have to 0-2 drinks a  day. ? Be aware of how much alcohol is in your drink. In the [place] one drink equals one 12 oz bottle of beer (355 mL), one 5 oz glass of wine (148 mL), or one 1 oz glass of hard liquor (44 mL).   Lifestyle  Take daily care of your teeth and gums. Brush your teeth every morning and night with fluoride toothpaste. Floss one time each day.  Stay active. Exercise for at least 30 minutes 5 or more days each week.  Do not use any products that contain nicotine or tobacco, such as cigarettes,  e-cigarettes, and chewing tobacco. If you need help quitting, ask your health care provider.  Do not use drugs.  If you are sexually active, practice safe sex. Use a condom or other form of protection to prevent STIs (sexually transmitted infections).  Find healthy ways to cope with stress, such as: ? Meditation, yoga, or listening to music. ? Journaling. ? Talking to a trusted person. ? Spending time with friends and family. Safety  Always wear your seat belt while driving or riding in a vehicle.  Do not drive: ? If you have been drinking alcohol. Do not ride with someone who has been drinking. ? When you are tired or distracted. ? While texting.  Wear a helmet and other protective equipment during sports activities.  If you have firearms in your house, make sure you follow all gun safety procedures.  Seek help if you have been physically or sexually abused. What's next?  Go to your health care provider once a year for an annual wellness visit.  Ask your health care provider how often you should have your eyes and teeth checked.  Stay up to date on all vaccines. This information is not intended to replace advice given to you by your health care provider. Make sure you discuss any questions you have with your health care provider. Document Revised: 01/20/2019 Document Reviewed: 04/30/2018 Elsevier Patient Education  2021 [employer].

## 2020-06-13 NOTE — Progress Notes (Signed)
Chief Complaint:  BAILEN GEFFRE is a 35 y.o. male who presents today for his annual comprehensive physical exam.    Assessment/Plan:  New/Acute Problems: Tendon Nodule Will place referral to sports medicine. Will likely need steroid injection.  Chronic Problems Addressed Today: Hypertriglyceridemia We discussed lifestyle modifications. Check CBC, CMET, TSH, lipids.  Preventative Healthcare: Tdap given today. Check labs.  Patient Counseling(The following topics were reviewed and/or handout was given):  -Nutrition: Stressed importance of moderation in sodium/caffeine intake, saturated fat and cholesterol, caloric balance, sufficient intake of fresh fruits, vegetables, and fiber.  -Stressed the importance of regular exercise.   -Substance Abuse: Discussed cessation/primary prevention of tobacco, alcohol, or other drug use; driving or other dangerous activities under the influence; availability of treatment for abuse.   -Injury prevention: Discussed safety belts, safety helmets, smoke detector, smoking near bedding or upholstery.   -Sexuality: Discussed sexually transmitted diseases, partner selection, use of condoms, avoidance of unintended pregnancy and contraceptive alternatives.   -Dental health: Discussed importance of regular tooth brushing, flossing, and dental visits.  -Health maintenance and immunizations reviewed. Please refer to Health maintenance section.  Return to care in 1 year for next preventative visit.     Subjective:  HPI:  He has no acute complaints today. He has nodule on right middle finger. Has been there for couple of months. Seems to be getting worse. Some pain. Has full range of motion in finger.  Lifestyle Diet: None specific.  Exercise: Plays basketball with daughter.   Depression screen PHQ 2/9 06/13/2020  Decreased Interest 0  Down, Depressed, Hopeless 0  PHQ - 2 Score 0    Health Maintenance Due  Topic Date Due  . Hepatitis C Screening   Never done     ROS: Per HPI, otherwise a complete review of systems was negative.   PMH:  The following were reviewed and entered/updated in epic: Past Medical History:  Diagnosis Date  . Chicken pox    Patient Active Problem List   Diagnosis Date Noted  . Hypertriglyceridemia 10/13/2018  . Left knee pain 07/17/2017   History reviewed. No pertinent surgical history.  Family History  Problem Relation Age of Onset  . Gout Neg Hx     Medications- reviewed and updated No current outpatient medications on file.   No current facility-administered medications for this visit.    Allergies-reviewed and updated No Known Allergies  Social History   Socioeconomic History  . Marital status: Married    Spouse name: Not on file  . Number of children: 3  . Years of education: Not on file  . Highest education level: Not on file  Occupational History  . Not on file  Tobacco Use  . Smoking status: Never Smoker  . Smokeless tobacco: Never Used  Vaping Use  . Vaping Use: Never used  Substance and Sexual Activity  . Alcohol use: No  . Drug use: No  . Sexual activity: Yes    Partners: Female  Other Topics Concern  . Not on file  Social History Narrative  . Not on file   Social Determinants of Health   Financial Resource Strain: Not on file  Food Insecurity: Not on file  Transportation Needs: Not on file  Physical Activity: Not on file  Stress: Not on file  Social Connections: Not on file        Objective:  Physical Exam: BP 108/72   Pulse 75   Temp 98.6 F (37 C) (Temporal)   Ht 5'  8" (1.727 m)   Wt 210 lb 3.2 oz (95.3 kg)   SpO2 97%   BMI 31.96 kg/m   Body mass index is 31.96 kg/m. Wt Readings from Last 3 Encounters:  06/13/20 210 lb 3.2 oz (95.3 kg)  10/08/19 202 lb 12.8 oz (92 kg)  10/13/18 207 lb 3.2 oz (94 kg)   Gen: NAD, resting comfortably HEENT: TMs normal bilaterally. OP clear. No thyromegaly noted.  CV: RRR with no murmurs appreciated Pulm:  NWOB, CTAB with no crackles, wheezes, or rhonchi GI: Normal bowel sounds present. Soft, Nontender, Nondistended. MSK: no edema, cyanosis, or clubbing noted. Tender nodule noted at proximal flexor tendon on right third digit Skin: warm, dry Neuro: CN2-12 grossly intact. Strength 5/5 in upper and lower extremities. Reflexes symmetric and intact bilaterally.  Psych: Normal affect and thought content     Mitchel Delduca M. [name], MD 06/13/2020 9:14 AM

## 2020-06-13 NOTE — Assessment & Plan Note (Signed)
We discussed lifestyle modifications. Check CBC, CMET, TSH, lipids.

## 2020-06-14 NOTE — Progress Notes (Signed)
Please inform patient of the following:  Labs are all STABLE. Would like for him to keep up the good work. Do not need to make any changes to his treatment plan at this time. We can recheck in a year.  Katina Degree. [name], MD 06/14/2020 11:56 AM

## 2020-06-15 NOTE — Progress Notes (Signed)
Subjective:    I'm seeing this patient as a consultation for:  Dr. [name]. Note will be routed back to referring provider/PCP.  CC: R middle finger pain / nodule  I, Molly Weber, LAT, ATC, am serving as scribe for Dr. Clementeen Graham.  HPI: Pt is a 35 y/o male c/o R 3rd  finger pain/nodule x couple months.  He specifically locates the nodule to on the palmar aspect of the proximal phalanx. Pt c/o painful to grip. Pt works as an Personnel officer.  Catching/mechanical symptoms: no Aggravating factors: gripping Treatments tried: no  Past medical history, Surgical history, Family history, Social history, Allergies, and medications have been entered into the medical record, reviewed.   Review of Systems: No new headache, visual changes, nausea, vomiting, diarrhea, constipation, dizziness, abdominal pain, skin rash, fevers, chills, night sweats, weight loss, swollen lymph nodes, body aches, joint swelling, muscle aches, chest pain, shortness of breath, mood changes, visual or auditory hallucinations.   Objective:    Vitals:   06/16/20 0826  BP: 110/78  Pulse: 69  SpO2: 97%   General: Well Developed, well nourished, and in no acute distress.  Neuro/Psych: Alert and oriented x3, extra-ocular muscles intact, able to move all 4 extremities, sensation grossly intact. Skin: Warm and dry, no rashes noted.  Respiratory: Not using accessory muscles, speaking in full sentences, trachea midline.  Cardiovascular: Pulses palpable, no extremity edema. Abdomen: Does not appear distended. MSK: Right hand normal-appearing Tender palpable nodule at the palmar aspect of the proximal third phalanx just distal to the MCP.  Normal hand motion and strength.  No triggering present.  Lab and Radiology Results  Procedure: Real-time Ultrasound Guided Injection of right hand third digit ganglion cyst associated with the flexor tendon and proximal phalanx distal to MCP Device: Philips Affiniti 50G Images permanently  stored and available for review in PACS Ultrasound inspection reveals a small ganglion cyst associated with the flexor tendon nodule measuring 0.3 x 0.5 cm. Verbal informed consent obtained.  Discussed risks and benefits of procedure. Warned about infection bleeding damage to structures skin hypopigmentation and fat atrophy among others. Patient expresses understanding and agreement Time-out conducted.   Noted no overlying erythema, induration, or other signs of local infection.   Skin prepped in a sterile fashion.   Local anesthesia: Topical Ethyl chloride.   With sterile technique and under real time ultrasound guidance:  40 mg of Kenalog and 1 mL Marcaine injected into and around the ganglion cyst. Fluid seen entering the ganglion cyst.   Completed without difficulty   Pain immediately resolved suggesting accurate placement of the medication.   Advised to call if fevers/chills, erythema, induration, drainage, or persistent bleeding.   Images permanently stored and available for review in the ultrasound unit.  Impression: Technically successful ultrasound guided injection.       Impression and Recommendations:    Assessment and Plan: 35 y.o. male with third flexor tendon ganglion cyst right hand.  Successfully injected today in clinic.  Plan for double Band-Aid splint and relative rest.  Recheck back as needed.Marland Kitchen  PDMP not reviewed this encounter. Orders Placed This Encounter  Procedures  . [place] LIMITED JOINT SPACE STRUCTURES UP RIGHT(NO LINKED CHARGES)    Standing Status:   Future    Number of Occurrences:   1    Standing Expiration Date:   12/14/2020    Order Specific Question:   Reason for Exam (SYMPTOM  OR DIAGNOSIS REQUIRED)    Answer:   finger pain  Order Specific Question:   Preferred imaging location?    Answer:   [facility]   No orders of the defined types were placed in this encounter.   Discussed warning signs or symptoms. Please see  discharge instructions. Patient expresses understanding.   The above documentation has been reviewed and is accurate and complete Lynne Leader, M.D.

## 2020-06-16 ENCOUNTER — Encounter: Payer: Self-pay | Admitting: Family Medicine

## 2020-06-16 ENCOUNTER — Ambulatory Visit: Payer: BC Managed Care – PPO | Admitting: Family Medicine

## 2020-06-16 ENCOUNTER — Other Ambulatory Visit: Payer: Self-pay

## 2020-06-16 ENCOUNTER — Encounter: Payer: Managed Care, Other (non HMO) | Admitting: Family Medicine

## 2020-06-16 ENCOUNTER — Ambulatory Visit: Payer: Self-pay

## 2020-06-16 VITALS — BP 110/78 | HR 69 | Ht 68.0 in | Wt 209.8 lb

## 2020-06-16 DIAGNOSIS — M67441 Ganglion, right hand: Secondary | ICD-10-CM | POA: Diagnosis not present

## 2020-06-16 DIAGNOSIS — M79644 Pain in right finger(s): Secondary | ICD-10-CM

## 2020-06-16 NOTE — Patient Instructions (Signed)
Thank you for coming in today.  Call or go to the ER if you develop a large red swollen joint with extreme pain or oozing puss.    Ganglion Cyst  A ganglion cyst is a non-cancerous, fluid-filled lump of tissue that occurs near a joint, tendon, or ligament. The cyst grows out of a joint or the lining of a tendon or ligament. Ganglion cysts most often develop in the hand or wrist, but they can also develop in the shoulder, elbow, hip, knee, ankle, or foot. Ganglion cysts are ball-shaped or egg-shaped. Their size can range from the size of a pea to larger than a grape. Increased activity may cause the cyst to get bigger because more fluid starts to build up. What are the causes? The exact cause of this condition is not known, but it may be related to:  Inflammation or irritation around the joint.  An injury or tear in the layers of tissue around the joint (joint capsule).  Repetitive movements or overuse.  History of acute or repeated injury. What increases the risk? You are more likely to develop this condition if:  You are a male.  You are 72-75 years old. What are the signs or symptoms? The main symptom of this condition is a lump. It most often appears on the hand or wrist. In many cases, there are no other symptoms, but a cyst can sometimes cause:  Tingling.  Pain or tenderness.  Numbness.  Weakness or loss of strength in the affected joint.  Decreased range of motion in the affected area of the body.   How is this diagnosed? Ganglion cysts are usually diagnosed based on a physical exam. Your health care provider will feel the lump and may shine a light next to it. If it is a ganglion cyst, the light will likely shine through it. Your health care provider may order an X-ray, ultrasound, MRI, or CT scan to rule out other conditions. How is this treated? Ganglion cysts often go away on their own without treatment. If you have pain or other symptoms, treatment may be needed.  Treatment is also needed if the ganglion cyst limits your movement or if it gets infected. Treatment may include:  Wearing a brace or splint on your wrist or finger.  Taking anti-inflammatory medicine.  Having fluid drained from the lump with a needle (aspiration).  Getting an injection of medicine into the joint to decrease inflammation. This may be corticosteroids, ethanol, or hyaluronidase.  Having surgery to remove the ganglion cyst.  Placing a pad in your shoe or wearing shoes that will not rub against the cyst if it is on your foot. Follow these instructions at home:  Do not press on the ganglion cyst, poke it with a needle, or hit it.  Take over-the-counter and prescription medicines only as told by your health care provider.  If you have a brace or splint: ? Wear it as told by your health care provider. ? Remove it as told by your health care provider. Ask if you need to remove it when you take a shower or a bath.  Watch your ganglion cyst for any changes.  Keep all follow-up visits as told by your health care provider. This is important. Contact a health care provider if:  Your ganglion cyst becomes larger or more painful.  You have pus coming from the lump.  You have weakness or numbness in the affected area.  You have a fever or chills. Get help right  away if:  You have a fever and have any of these in the cyst area: ? Increased redness. ? Red streaks. ? Swelling. Summary  A ganglion cyst is a non-cancerous, fluid-filled lump that occurs near a joint, tendon, or ligament.  Ganglion cysts most often develop in the hand or wrist, but they can also develop in the shoulder, elbow, hip, knee, ankle, or foot.  Ganglion cysts often go away on their own without treatment. This information is not intended to replace advice given to you by your health care provider. Make sure you discuss any questions you have with your health care provider. Document Revised:  07/28/2019 Document Reviewed: 07/28/2019 Elsevier Patient Education  2021 [employer].

## 2020-08-14 DIAGNOSIS — Z113 Encounter for screening for infections with a predominantly sexual mode of transmission: Secondary | ICD-10-CM | POA: Diagnosis not present

## 2020-08-14 DIAGNOSIS — Z3141 Encounter for fertility testing: Secondary | ICD-10-CM | POA: Diagnosis not present

## 2021-06-18 ENCOUNTER — Encounter: Payer: Self-pay | Admitting: Family Medicine

## 2021-06-18 ENCOUNTER — Ambulatory Visit (INDEPENDENT_AMBULATORY_CARE_PROVIDER_SITE_OTHER): Payer: BC Managed Care – PPO | Admitting: Family Medicine

## 2021-06-18 VITALS — BP 115/83 | HR 75 | Temp 97.5°F | Ht 68.0 in | Wt 214.8 lb

## 2021-06-18 DIAGNOSIS — R059 Cough, unspecified: Secondary | ICD-10-CM | POA: Diagnosis not present

## 2021-06-18 DIAGNOSIS — Z0001 Encounter for general adult medical examination with abnormal findings: Secondary | ICD-10-CM

## 2021-06-18 DIAGNOSIS — Z6832 Body mass index (BMI) 32.0-32.9, adult: Secondary | ICD-10-CM

## 2021-06-18 DIAGNOSIS — E781 Pure hyperglyceridemia: Secondary | ICD-10-CM | POA: Diagnosis not present

## 2021-06-18 DIAGNOSIS — E669 Obesity, unspecified: Secondary | ICD-10-CM | POA: Insufficient documentation

## 2021-06-18 DIAGNOSIS — R739 Hyperglycemia, unspecified: Secondary | ICD-10-CM | POA: Diagnosis not present

## 2021-06-18 LAB — COMPREHENSIVE METABOLIC PANEL
ALT: 34 U/L (ref 0–53)
AST: 24 U/L (ref 0–37)
Albumin: 4.7 g/dL (ref 3.5–5.2)
Alkaline Phosphatase: 76 U/L (ref 39–117)
BUN: 15 mg/dL (ref 6–23)
CO2: 28 mEq/L (ref 19–32)
Calcium: 9.9 mg/dL (ref 8.4–10.5)
Chloride: 103 mEq/L (ref 96–112)
Creatinine, Ser: 1.12 mg/dL (ref 0.40–1.50)
GFR: 83.77 mL/min (ref >60.00)
Glucose, Bld: 96 mg/dL (ref 70–99)
Potassium: 3.9 mEq/L (ref 3.5–5.1)
Sodium: 140 mEq/L (ref 135–145)
Total Bilirubin: 0.6 mg/dL (ref 0.2–1.2)
Total Protein: 7.2 g/dL (ref 6.0–8.3)

## 2021-06-18 LAB — LIPID PANEL
Cholesterol: 140 mg/dL (ref 0–200)
HDL: 38.8 mg/dL — ABNORMAL LOW (ref >39.00)
LDL Cholesterol: 75 mg/dL (ref 0–99)
NonHDL: 101.23
Total CHOL/HDL Ratio: 4
Triglycerides: 133 mg/dL (ref 0.0–149.0)
VLDL: 26.6 mg/dL (ref 0.0–40.0)

## 2021-06-18 LAB — TSH: TSH: 1.65 u[IU]/mL (ref 0.35–5.50)

## 2021-06-18 LAB — CBC
HCT: 48 % (ref 39.0–52.0)
Hemoglobin: 15.8 g/dL (ref 13.0–17.0)
MCHC: 33 g/dL (ref 30.0–36.0)
MCV: 89.6 fl (ref 78.0–100.0)
Platelets: 197 10*3/uL (ref 150.0–400.0)
RBC: 5.35 Mil/uL (ref 4.22–5.81)
RDW: 14.3 % (ref 11.5–15.5)
WBC: 6.5 10*3/uL (ref 4.0–10.5)

## 2021-06-18 LAB — HEMOGLOBIN A1C: Hgb A1c MFr Bld: 5.4 % (ref 4.6–6.5)

## 2021-06-18 MED ORDER — AZELASTINE HCL 0.1 % NA SOLN: 2.0000 | Freq: Two times a day (BID) | NASAL | 12 refills | Status: DC

## 2021-06-18 MED ORDER — BENZONATATE 200 MG PO CAPS: 200.0000 mg | ORAL_CAPSULE | Freq: Two times a day (BID) | ORAL | 0 refills | Status: DC | PRN

## 2021-06-18 MED ORDER — AZITHROMYCIN 250 MG PO TABS: ORAL_TABLET | ORAL | 0 refills | Status: DC

## 2021-06-18 NOTE — Assessment & Plan Note (Signed)
Discussed lifestyle modifications.  Will place referral to medical weight management per patient request.

## 2021-06-18 NOTE — Assessment & Plan Note (Signed)
Check labs 

## 2021-06-18 NOTE — Progress Notes (Signed)
Chief Complaint:  Steven Vaughan is a 36 y.o. male who presents today for his annual comprehensive physical exam.    Assessment/Plan:  New/Acute Problems: Sinusitis Likely viral URI.  No red flags.  Start Astelin and Tessalon.  We will send pocket prescription for azithromycin with instruction to not start unless symptoms worsen or do not improve.  Encouraged hydration.  Discussed reasons to return to care.  Follow-up as needed.  Chronic Problems Addressed Today: Obesity Discussed lifestyle modifications.  Will place referral to medical weight management per patient request.   Hypertriglyceridemia Check labs.   Body mass index is 32.66 kg/m. / Obese  BMI Metric Follow Up - 06/18/21 0904       BMI Metric Follow Up-Please document annually   BMI Metric Follow Up Education provided              Preventative Healthcare: Check labs.  Flu shot declined.  Patient Counseling(The following topics were reviewed and/or handout was given):  -Nutrition: Stressed importance of moderation in sodium/caffeine intake, saturated fat and cholesterol, caloric balance, sufficient intake of fresh fruits, vegetables, and fiber.  -Stressed the importance of regular exercise.   -Substance Abuse: Discussed cessation/primary prevention of tobacco, alcohol, or other drug use; driving or other dangerous activities under the influence; availability of treatment for abuse.   -Injury prevention: Discussed safety belts, safety helmets, smoke detector, smoking near bedding or upholstery.   -Sexuality: Discussed sexually transmitted diseases, partner selection, use of condoms, avoidance of unintended pregnancy and contraceptive alternatives.   -Dental health: Discussed importance of regular tooth brushing, flossing, and dental visits.  -Health maintenance and immunizations reviewed. Please refer to Health maintenance section.  Return to care in 1 year for next preventative visit.     Subjective:   HPI:  He has no acute complaints today.   Patient has also had uri symptoms for the last week. Symptoms are about the same. Wife and daughter are sick with similar symptoms. Cough and congestion.   Lifestyle Diet: Balanced. Plenty of fruits and vegetables.  Exercise: Does a lot of walking with work.   Depression screen PHQ 2/9 06/18/2021  Decreased Interest 0  Down, Depressed, Hopeless 0  PHQ - 2 Score 0    Health Maintenance Due  Topic Date Due   Hepatitis C Screening  Never done   COVID-19 Vaccine (3 - Booster for Pfizer series) 04/21/2020     ROS: Per HPI, otherwise a complete review of systems was negative.   PMH:  The following were reviewed and entered/updated in epic: Past Medical History:  Diagnosis Date   Chicken pox    Patient Active Problem List   Diagnosis Date Noted   Obesity 06/18/2021   Ganglion cyst of flexor tendon sheath of finger of right hand 06/16/2020   Hypertriglyceridemia 10/13/2018   Left knee pain 07/17/2017   History reviewed. No pertinent surgical history.  Family History  Problem Relation Age of Onset   Gout Neg Hx     Medications- reviewed and updated Current Outpatient Medications  Medication Sig Dispense Refill   azelastine (ASTELIN) 0.1 % nasal spray Place 2 sprays into both nostrils 2 (two) times daily. 30 mL 12   azithromycin (ZITHROMAX) 250 MG tablet Take 2 tabs day 1, then 1 tab daily 6 each 0   benzonatate (TESSALON) 200 MG capsule Take 1 capsule (200 mg total) by mouth 2 (two) times daily as needed for cough. 20 capsule 0   No current facility-administered medications for  this visit.    Allergies-reviewed and updated Not on File  Social History   Socioeconomic History   Marital status: Married    Spouse name: Not on file   Number of children: 3   Years of education: Not on file   Highest education level: Not on file  Occupational History   Not on file  Tobacco Use   Smoking status: Never   Smokeless tobacco:  Never  Vaping Use   Vaping Use: Never used  Substance and Sexual Activity   Alcohol use: No   Drug use: No   Sexual activity: Yes    Partners: Female  Other Topics Concern   Not on file  Social History Narrative   Not on file   Social Determinants of Health   Financial Resource Strain: Not on file  Food Insecurity: Not on file  Transportation Needs: Not on file  Physical Activity: Not on file  Stress: Not on file  Social Connections: Not on file        Objective:  Physical Exam: BP 115/83    Pulse 75    Temp (!) 97.5 F (36.4 C) (Temporal)    Ht 5\' 8"  (1.727 m)    Wt 214 lb 12.8 oz (97.4 kg)    SpO2 97%    BMI 32.66 kg/m   Body mass index is 32.66 kg/m. Wt Readings from Last 3 Encounters:  06/18/21 214 lb 12.8 oz (97.4 kg)  06/16/20 209 lb 12.8 oz (95.2 kg)  06/13/20 210 lb 3.2 oz (95.3 kg)   Gen: NAD, resting comfortably HEENT: TMs normal bilaterally. OP clear. No thyromegaly noted.  CV: RRR with no murmurs appreciated Pulm: NWOB, CTAB with no crackles, wheezes, or rhonchi GI: Normal bowel sounds present. Soft, Nontender, Nondistended. MSK: no edema, cyanosis, or clubbing noted Skin: warm, dry Neuro: CN2-12 grossly intact. Strength 5/5 in upper and lower extremities. Reflexes symmetric and intact bilaterally.  Psych: Normal affect and thought content     Anali Cabanilla M. 06/15/20, MD 06/18/2021 9:04 AM

## 2021-06-18 NOTE — Patient Instructions (Signed)
It was very nice to see you today!  Please start the nasal spray and cough medication.  Please start the antibiotic if your symptoms not improving.  We will check blood work today.  I will see you back in year for your next physical.  Please come back to see me sooner if needed.  Take care, Dr [name]  PLEASE NOTE:  If you had any lab tests please let us know if you have not heard back within a few days. You may see your results on mychart before we have a chance to review them but we will give you a call once they are reviewed by [place]. If we ordered any referrals today, please let us know if you have not heard from their office within the next week.   Please try these tips to maintain a healthy lifestyle:  Eat at least 3 REAL meals and 1-2 snacks per day.  Aim for no more than 5 hours between eating.  If you eat breakfast, please do so within one hour of getting up.   Each meal should contain half fruits/vegetables, one quarter protein, and one quarter carbs (no bigger than a computer mouse)  Cut down on sweet beverages. This includes juice, soda, and sweet tea.   Drink at least 1 glass of water with each meal and aim for at least 8 glasses per day  Exercise at least 150 minutes every week.    Preventive Care 2-31 Years Old, Male Preventive care refers to lifestyle choices and visits with your health care provider that can promote health and wellness. Preventive care visits are also called wellness exams. What can I expect for my preventive care visit? Counseling During your preventive care visit, your health care provider may ask about your: Medical history, including: Past medical problems. Family medical history. Current health, including: Emotional well-being. Home life and relationship well-being. Sexual activity. Lifestyle, including: Alcohol, nicotine or tobacco, and drug use. Access to firearms. Diet, exercise, and sleep habits. Safety issues such as seatbelt and  bike helmet use. Sunscreen use. Work and work Astronomer. Physical exam Your health care provider may check your: Height and weight. These may be used to calculate your BMI (body mass index). BMI is a measurement that tells if you are at a healthy weight. Waist circumference. This measures the distance around your waistline. This measurement also tells if you are at a healthy weight and may help predict your risk of certain diseases, such as type 2 diabetes and high blood pressure. Heart rate and blood pressure. Body temperature. Skin for abnormal spots. What immunizations do I need? Vaccines are usually given at various ages, according to a schedule. Your health care provider will recommend vaccines for you based on your age, medical history, and lifestyle or other factors, such as travel or where you work. What tests do I need? Screening Your health care provider may recommend screening tests for certain conditions. This may include: Lipid and cholesterol levels. Diabetes screening. This is done by checking your blood sugar (glucose) after you have not eaten for a while (fasting). Hepatitis B test. Hepatitis C test. HIV (human immunodeficiency virus) test. STI (sexually transmitted infection) testing, if you are at risk. Talk with your health care provider about your test results, treatment options, and if necessary, the need for more tests. Follow these instructions at home: Eating and drinking  Eat a healthy diet that includes fresh fruits and vegetables, whole grains, lean protein, and low-fat dairy products. Drink enough  fluid to keep your urine pale yellow. Take vitamin and mineral supplements as recommended by your health care provider. Do not drink alcohol if your health care provider tells you not to drink. If you drink alcohol: Limit how much you have to 0-2 drinks a day. Know how much alcohol is in your drink. In the [place] one drink equals one 12 oz bottle of beer (355  mL), one 5 oz glass of wine (148 mL), or one 1 oz glass of hard liquor (44 mL). Lifestyle Brush your teeth every morning and night with fluoride toothpaste. Floss one time each day. Exercise for at least 30 minutes 5 or more days each week. Do not use any products that contain nicotine or tobacco. These products include cigarettes, chewing tobacco, and vaping devices, such as e-cigarettes. If you need help quitting, ask your health care provider. Do not use drugs. If you are sexually active, practice safe sex. Use a condom or other form of protection to prevent STIs. Find healthy ways to manage stress, such as: Meditation, yoga, or listening to music. Journaling. Talking to a trusted person. Spending time with friends and family. Minimize exposure to UV radiation to reduce your risk of skin cancer. Safety Always wear your seat belt while driving or riding in a vehicle. Do not drive: If you have been drinking alcohol. Do not ride with someone who has been drinking. If you have been using any mind-altering substances or drugs. While texting. When you are tired or distracted. Wear a helmet and other protective equipment during sports activities. If you have firearms in your house, make sure you follow all gun safety procedures. Seek help if you have been physically or sexually abused. What's next? Go to your health care provider once a year for an annual wellness visit. Ask your health care provider how often you should have your eyes and teeth checked. Stay up to date on all vaccines. This information is not intended to replace advice given to you by your health care provider. Make sure you discuss any questions you have with your health care provider. Document Revised: 11/01/2020 Document Reviewed: 11/01/2020 Elsevier Patient Education  2022 [employer].

## 2021-06-19 LAB — ABO AND RH

## 2021-06-19 NOTE — Progress Notes (Signed)
Please inform patient of the following:  His blood type is O positive.  His "good" cholesterol is a little low but everything else is NORMAL. We do not need to make any changes to his treatment plan at this time. HE should continue working on diet and exercise and we can recheck in a year.  Katina Degree. [name], MD 06/19/2021 2:13 PM

## 2021-06-20 ENCOUNTER — Encounter: Payer: Self-pay | Admitting: Family Medicine

## 2021-06-21 NOTE — Telephone Encounter (Signed)
Please advise 

## 2021-06-22 NOTE — Telephone Encounter (Signed)
Not a lot of studies so far that support use of triptans for weight loss.    They can be useful to treat acute migraines however daily use is generally discouraged as it can cause issues with rebound headaches and rebound migraines.  Do not think it would be a good idea to use daily.  We can discuss alternatives if Steven Vaughan is interested such as Ozempic, Wegovy, or Mounjaro.

## 2021-07-19 ENCOUNTER — Ambulatory Visit: Payer: BC Managed Care – PPO | Admitting: Family Medicine

## 2021-07-19 ENCOUNTER — Encounter: Payer: Self-pay | Admitting: Family Medicine

## 2021-07-19 ENCOUNTER — Other Ambulatory Visit: Payer: Self-pay

## 2021-07-19 VITALS — BP 121/82 | HR 67 | Temp 98.1°F | Ht 68.0 in | Wt 212.6 lb

## 2021-07-19 DIAGNOSIS — Z6832 Body mass index (BMI) 32.0-32.9, adult: Secondary | ICD-10-CM

## 2021-07-19 DIAGNOSIS — E669 Obesity, unspecified: Secondary | ICD-10-CM

## 2021-07-19 MED ORDER — OZEMPIC (0.25 OR 0.5 MG/DOSE) 2 MG/1.5ML ~~LOC~~ SOPN: 0.5000 mg | PEN_INJECTOR | SUBCUTANEOUS | 0 refills | Status: DC

## 2021-07-19 NOTE — Patient Instructions (Signed)
It was very nice to see you today! ? ?We will start the Ozempic.  Please take 0.25 mg weekly for 4 weeks and then increase to the higher dose. ? ?Send me a message in a few weeks let me know how you are doing. ? ?Take care, ?Dr [name] ? ?PLEASE NOTE: ? ?If you had any lab tests please let us know if you have not heard back within a few days. You may see your results on mychart before we have a chance to review them but we will give you a call once they are reviewed by [place]. If we ordered any referrals today, please let us know if you have not heard from their office within the next week.  ? ?Please try these tips to maintain a healthy lifestyle: ? ?Eat at least 3 REAL meals and 1-2 snacks per day.  Aim for no more than 5 hours between eating.  If you eat breakfast, please do so within one hour of getting up.  ? ?Each meal should contain half fruits/vegetables, one quarter protein, and one quarter carbs (no bigger than a computer mouse) ? ?Cut down on sweet beverages. This includes juice, soda, and sweet tea.  ? ?Drink at least 1 glass of water with each meal and aim for at least 8 glasses per day ? ?Exercise at least 150 minutes every week.   ?

## 2021-07-19 NOTE — Progress Notes (Signed)
? ?  Steven Vaughan is a 36 y.o. male who presents today for an office visit. ? ?Assessment/Plan:  ?Chronic Problems Addressed Today: ?Obesity ?BMI 32. We discussed weight loss medication options.  He would likely do well with GLP-1 agonist.  We will start semaglutide.  We discussed potential side effects.  Start 0.25 mg weekly for 4 weeks and then increase to 0.5 mg weekly.  He will follow-up with me in a few weeks via MyChart. We can titrate dose as needed.  ? ? ?  ?Subjective:  ?HPI: ? ?Patient here with concerned about his weight gain. He notes he has been skipping meal and thinks this could be contributing to his weight gain. He has been eating less and sometimes skip breakfast. He has not tried any medication for this issue. He is interested to start on low dose of Ozempic. No fatigue or difficulty breathing. No reported fever or chills.  ? ?   ?  ?Objective:  ?Physical Exam: ?BP 121/82 (BP Location: Left Arm)   Pulse 67   Temp 98.1 ?F (36.7 ?C) (Temporal)   Ht 5\' 8"  (1.727 m)   Wt 212 lb 9.6 oz (96.4 kg)   SpO2 97%   BMI 32.33 kg/m?   ?Gen: No acute distress, resting comfortably ?Neuro: Grossly normal, moves all extremities ?Psych: Normal affect and thought content ? ?   ? ? ?I,Savera Zaman,acting as a for Neurosurgeon, MD.,have documented all relevant documentation on the behalf of Jacquiline Doe, MD,as directed by  Jacquiline Doe, MD while in the presence of Jacquiline Doe, MD.  ? ?I, Jacquiline Doe, MD, have reviewed all documentation for this visit. The documentation on 07/19/21 for the exam, diagnosis, procedures, and orders are all accurate and complete. ? ?09/18/21. Katina Degree, MD ?07/19/2021 8:56 AM  ? ?

## 2021-07-19 NOTE — Assessment & Plan Note (Addendum)
BMI 32. We discussed weight loss medication options.  He would likely do well with GLP-1 agonist.  We will start semaglutide.  We discussed potential side effects.  Start 0.25 mg weekly for 4 weeks and then increase to 0.5 mg weekly.  He will follow-up with me in a few weeks via MyChart. We can titrate dose as needed.  ?

## 2021-07-24 ENCOUNTER — Ambulatory Visit: Payer: BC Managed Care – PPO | Admitting: Family Medicine

## 2021-10-18 ENCOUNTER — Encounter: Payer: Self-pay | Admitting: Family Medicine

## 2021-10-22 NOTE — Telephone Encounter (Signed)
Please have him schedule an appointment to discuss. Ideally this should be with me later this month once I return to the office from paternity leave.  Steven Vaughan. Jerline Pain, MD 10/22/2021 8:04 AM

## 2021-10-24 NOTE — Telephone Encounter (Signed)
Please schedule appointment with PCP.

## 2021-10-25 NOTE — Telephone Encounter (Signed)
LVM to call and schedule an appt with Jerline Pain

## 2021-11-22 ENCOUNTER — Encounter: Payer: Self-pay | Admitting: Family Medicine

## 2021-11-22 ENCOUNTER — Telehealth (INDEPENDENT_AMBULATORY_CARE_PROVIDER_SITE_OTHER): Payer: BC Managed Care – PPO | Admitting: Family Medicine

## 2021-11-22 VITALS — Ht 68.0 in | Wt 208.0 lb

## 2021-11-22 DIAGNOSIS — Z6832 Body mass index (BMI) 32.0-32.9, adult: Secondary | ICD-10-CM

## 2021-11-22 DIAGNOSIS — E669 Obesity, unspecified: Secondary | ICD-10-CM | POA: Diagnosis not present

## 2021-11-22 MED ORDER — PHENTERMINE HCL 37.5 MG PO CAPS: 37.5000 mg | ORAL_CAPSULE | ORAL | 2 refills | Status: DC

## 2021-11-22 NOTE — Assessment & Plan Note (Signed)
He is down about 4 pounds since last visit, though his BMI is 31 today with comorbidities. he could not tolerate semaglutide and it was additionally cost prohibitive.  We discussed alternatives.   He asked about phentermine due to a few of his other family numbers being on this medication.  Discussed with patient we could try this for a short time however this should not be a long-term medication for more than a few months.  We also discussed side effects including heart arrhythmias and increased blood pressure.  We will send in prescription for phentermine 37.5 mg daily.  We will follow-up in a couple weeks.  We discussed reasons to return to care sooner.

## 2021-11-22 NOTE — Progress Notes (Signed)
   JAH ALARID is a 36 y.o. male who presents today for a virtual office visit.  Assessment/Plan:  Chronic Problems Addressed Today: Obesity He is down about 4 pounds since last visit, though his BMI is 31 today with comorbidities. he could not tolerate semaglutide and it was additionally cost prohibitive.  We discussed alternatives.   He asked about phentermine due to a few of his other family numbers being on this medication.  Discussed with patient we could try this for a short time however this should not be a long-term medication for more than a few months.  We also discussed side effects including heart arrhythmias and increased blood pressure.  We will send in prescription for phentermine 37.5 mg daily.  We will follow-up in a couple weeks.  We discussed reasons to return to care sooner.     Subjective:  HPI:  Patient here for obesity follow up. We saw him about 3 months ago and started Ozempic.  He could not tolerate this due to nausea.  Additionally insurance would not pay for this 100,000 hours/month out of pocket.  He is interested in trying alternative medications to help with weight loss.  He is working on diet and exercise however has had some difficulty with weight loss.  He has had a few family members who are not phentermine which seems to work well for them.       Objective/Observations  Physical Exam: Wt Readings from Last 3 Encounters:  11/22/21 208 lb (94.3 kg)  07/19/21 212 lb 9.6 oz (96.4 kg)  06/18/21 214 lb 12.8 oz (97.4 kg)    Gen: NAD, resting comfortably Pulm: Normal work of breathing Neuro: Grossly normal, moves all extremities Psych: Normal affect and thought content  Virtual Visit via Video   I connected with ANTIONE OBAR on 11/22/21 at 11:00 AM EDT by a video enabled telemedicine application and verified that I am speaking with the correct person using two identifiers. The limitations of evaluation and management by telemedicine and the  availability of in person appointments were discussed. The patient expressed understanding and agreed to proceed.   Patient location: Home Provider location: [place] [employer] [facility] Persons participating in the virtual visit: Myself and Patient     Katina Degree. [name], MD 11/22/2021 11:31 AM

## 2021-12-13 ENCOUNTER — Ambulatory Visit: Payer: BC Managed Care – PPO | Admitting: Family

## 2021-12-13 ENCOUNTER — Encounter: Payer: Self-pay | Admitting: Family

## 2021-12-13 VITALS — BP 122/84 | HR 76 | Temp 97.8°F | Ht 68.0 in | Wt 206.0 lb

## 2021-12-13 DIAGNOSIS — T63441A Toxic effect of venom of bees, accidental (unintentional), initial encounter: Secondary | ICD-10-CM

## 2021-12-13 MED ORDER — DIPHENHYDRAMINE HCL 50 MG/ML IJ SOLN
50.0000 mg | Freq: Once | INTRAMUSCULAR | Status: AC
  Administered 2021-12-13: 50 mg via INTRAMUSCULAR

## 2021-12-13 NOTE — Patient Instructions (Addendum)
It was very nice to see you today!  We gave you a benadryl shot today to hopefully stop the swelling in your face.  You can take 1 Benadryl tonight at bedtime.  You also can take generic Claritin or Xyzal, over the counter today and for the next few days to help your immune system stop overreacting.     PLEASE NOTE:  If you had any lab tests please let us know if you have not heard back within a few days. You may see your results on MyChart before we have a chance to review them but we will give you a call once they are reviewed by [place]. If we ordered any referrals today, please let us know if you have not heard from their office within the next week.

## 2021-12-13 NOTE — Progress Notes (Signed)
   Patient ID: Steven Vaughan, male    DOB: 06-14-1985, 36 y.o.   MRN: [id number]  Chief Complaint  Patient presents with   Insect Bite    Pt c/o bee sting on 7/25, pt states he had swelling on his face. Has tried benadryl    HPI: Insect bite:   took 1 benadryl 2 days ago and then 2 benadryl yesterday, on right side of nose and has slowly increased with swelling since then, spreading to right side of face and upper lip that is numb. Denies any tingling or pain, no fever, body aches, denies dysphagia or throat irritation, no SOB.   Assessment & Plan:  1. Bee sting reaction, accidental or unintentional, initial encounter given benadryl injection in office, pt is with wife who will drive him home. Advised on SE of med. OK to take another Benadryl tonight at bedtime and take OTC generic Claritin or Xyzal qd for next 3 days. If swelling is not diminishing or starts worsening again, call the office.  - diphenhydrAMINE (BENADRYL) injection 50 mg    Subjective:    Outpatient Medications Prior to Visit  Medication Sig Dispense Refill   phentermine 37.5 MG capsule Take 1 capsule (37.5 mg total) by mouth every morning. 30 capsule 2   No facility-administered medications prior to visit.   Past Medical History:  Diagnosis Date   Chicken pox    No past surgical history on file. No Known Allergies    Objective:    Physical Exam Vitals and nursing note reviewed.  Constitutional:      General: He is not in acute distress.    Appearance: Normal appearance.  HENT:     Head: Normocephalic.  Cardiovascular:     Rate and Rhythm: Normal rate and regular rhythm.  Pulmonary:     Effort: Pulmonary effort is normal.     Breath sounds: Normal breath sounds.  Musculoskeletal:        General: Normal range of motion.     Cervical back: Normal range of motion.  Skin:    General: Skin is warm and dry.     Findings: Lesion (pinpoint mark noted on right outer nostril in facial crease, small amount  swelling to the right of mark and down to upper lip, no erythema or warmth) present.  Neurological:     Mental Status: He is alert and oriented to person, place, and time.  Psychiatric:        Mood and Affect: Mood normal.    BP 122/84 (BP Location: Left Arm, Patient Position: Sitting, Cuff Size: Large)   Pulse 76   Temp 97.8 F (36.6 C) (Temporal)   Ht 5\' 8"  (1.727 m)   Wt 206 lb (93.4 kg)   SpO2 97%   BMI 31.32 kg/m  Wt Readings from Last 3 Encounters:  12/13/21 206 lb (93.4 kg)  11/22/21 208 lb (94.3 kg)  07/19/21 212 lb 9.6 oz (96.4 kg)       09/18/21, NP

## 2022-01-29 ENCOUNTER — Encounter: Payer: Self-pay | Admitting: Family Medicine

## 2022-01-29 NOTE — Telephone Encounter (Signed)
See note

## 2022-01-30 ENCOUNTER — Telehealth: Payer: Self-pay | Admitting: Family Medicine

## 2022-01-30 NOTE — Telephone Encounter (Signed)
Wife came in person to check on patient's medication.   Wife states:  - Although phentermine capsules are on national backorder, the tablets are in stock  Wife requests: - phentermine TABLETS be sent in to pharmacy   Pharmacy : [id number] - [place], [place] - [address] [place] [address] AT [facility] [address] [place] [address]  [address] [place] [address] [place], [place] [address]  Phone:  [phone]  Fax:  [phone]

## 2022-01-31 MED ORDER — PHENTERMINE HCL 37.5 MG PO TABS: 37.5000 mg | ORAL_TABLET | Freq: Every day | ORAL | 2 refills | Status: DC

## 2022-02-07 NOTE — Telephone Encounter (Signed)
Tablets were sent in a week ago.  Algis Greenhouse. Jerline Pain, MD 02/07/2022 7:30 AM

## 2022-02-11 ENCOUNTER — Encounter: Payer: Self-pay | Admitting: *Deleted

## 2022-05-02 ENCOUNTER — Encounter: Payer: Self-pay | Admitting: *Deleted

## 2022-06-04 ENCOUNTER — Telehealth: Payer: Managed Care, Other (non HMO) | Admitting: Family Medicine

## 2022-06-21 ENCOUNTER — Encounter: Payer: Self-pay | Admitting: Family Medicine

## 2022-06-21 ENCOUNTER — Ambulatory Visit (INDEPENDENT_AMBULATORY_CARE_PROVIDER_SITE_OTHER): Payer: Managed Care, Other (non HMO) | Admitting: Family Medicine

## 2022-06-21 VITALS — BP 116/76 | HR 68 | Temp 97.5°F | Ht 68.0 in | Wt 185.4 lb

## 2022-06-21 DIAGNOSIS — R739 Hyperglycemia, unspecified: Secondary | ICD-10-CM | POA: Diagnosis not present

## 2022-06-21 DIAGNOSIS — E669 Obesity, unspecified: Secondary | ICD-10-CM | POA: Diagnosis not present

## 2022-06-21 DIAGNOSIS — Z0001 Encounter for general adult medical examination with abnormal findings: Secondary | ICD-10-CM

## 2022-06-21 DIAGNOSIS — E781 Pure hyperglyceridemia: Secondary | ICD-10-CM

## 2022-06-21 DIAGNOSIS — Z6832 Body mass index (BMI) 32.0-32.9, adult: Secondary | ICD-10-CM

## 2022-06-21 DIAGNOSIS — Z1159 Encounter for screening for other viral diseases: Secondary | ICD-10-CM | POA: Diagnosis not present

## 2022-06-21 LAB — LIPID PANEL
Cholesterol: 145 mg/dL (ref 0–200)
HDL: 44.1 mg/dL (ref >39.00)
LDL Cholesterol: 73 mg/dL (ref 0–99)
NonHDL: 100.91
Total CHOL/HDL Ratio: 3
Triglycerides: 142 mg/dL (ref 0.0–149.0)
VLDL: 28.4 mg/dL (ref 0.0–40.0)

## 2022-06-21 LAB — COMPREHENSIVE METABOLIC PANEL
ALT: 37 U/L (ref 0–53)
AST: 21 U/L (ref 0–37)
Albumin: 4.6 g/dL (ref 3.5–5.2)
Alkaline Phosphatase: 81 U/L (ref 39–117)
BUN: 16 mg/dL (ref 6–23)
CO2: 30 mEq/L (ref 19–32)
Calcium: 9.6 mg/dL (ref 8.4–10.5)
Chloride: 103 mEq/L (ref 96–112)
Creatinine, Ser: 1.05 mg/dL (ref 0.40–1.50)
GFR: 89.88 mL/min (ref >60.00)
Glucose, Bld: 92 mg/dL (ref 70–99)
Potassium: 4.6 mEq/L (ref 3.5–5.1)
Sodium: 141 mEq/L (ref 135–145)
Total Bilirubin: 0.4 mg/dL (ref 0.2–1.2)
Total Protein: 6.9 g/dL (ref 6.0–8.3)

## 2022-06-21 LAB — TSH: TSH: 1.2 u[IU]/mL (ref 0.35–5.50)

## 2022-06-21 LAB — CBC
HCT: 46.9 % (ref 39.0–52.0)
Hemoglobin: 16 g/dL (ref 13.0–17.0)
MCHC: 34.2 g/dL (ref 30.0–36.0)
MCV: 88.7 fl (ref 78.0–100.0)
Platelets: 255 10*3/uL (ref 150.0–400.0)
RBC: 5.29 Mil/uL (ref 4.22–5.81)
RDW: 13.9 % (ref 11.5–15.5)
WBC: 6 10*3/uL (ref 4.0–10.5)

## 2022-06-21 LAB — HEMOGLOBIN A1C: Hgb A1c MFr Bld: 5.4 % (ref 4.6–6.5)

## 2022-06-21 MED ORDER — PHENTERMINE HCL 37.5 MG PO TABS: 37.5000 mg | ORAL_TABLET | Freq: Every day | ORAL | 2 refills | Status: DC

## 2022-06-21 NOTE — Assessment & Plan Note (Signed)
Check lipids 

## 2022-06-21 NOTE — Patient Instructions (Signed)
It was very nice to see you today!  I am glad you are doing well.  Your cough should improve over the next few weeks.  Will check blood work today.  We will see back in year for your next physical.  Come back sooner if needed.  Take care, Dr Jerline Pain  PLEASE NOTE:  If you had any lab tests, please let us know if you have not heard back within a few days. You may see your results on mychart before we have a chance to review them but we will give you a call once they are reviewed by [place].   If we ordered any referrals today, please let us know if you have not heard from their office within the next week.   If you had any urgent prescriptions sent in today, please check with the pharmacy within an hour of our visit to make sure the prescription was transmitted appropriately.   Please try these tips to maintain a healthy lifestyle:  Eat at least 3 REAL meals and 1-2 snacks per day.  Aim for no more than 5 hours between eating.  If you eat breakfast, please do so within one hour of getting up.   Each meal should contain half fruits/vegetables, one quarter protein, and one quarter carbs (no bigger than a computer mouse)  Cut down on sweet beverages. This includes juice, soda, and sweet tea.   Drink at least 1 glass of water with each meal and aim for at least 8 glasses per day  Exercise at least 150 minutes every week.    Preventive Care 94-41 Years Old, Male Preventive care refers to lifestyle choices and visits with your health care provider that can promote health and wellness. Preventive care visits are also called wellness exams. What can I expect for my preventive care visit? Counseling During your preventive care visit, your health care provider may ask about your: Medical history, including: Past medical problems. Family medical history. Current health, including: Emotional well-being. Home life and relationship well-being. Sexual activity. Lifestyle, including: Alcohol,  nicotine or tobacco, and drug use. Access to firearms. Diet, exercise, and sleep habits. Safety issues such as seatbelt and bike helmet use. Sunscreen use. Work and work Statistician. Physical exam Your health care provider may check your: Height and weight. These may be used to calculate your BMI (body mass index). BMI is a measurement that tells if you are at a healthy weight. Waist circumference. This measures the distance around your waistline. This measurement also tells if you are at a healthy weight and may help predict your risk of certain diseases, such as type 2 diabetes and high blood pressure. Heart rate and blood pressure. Body temperature. Skin for abnormal spots. What immunizations do I need?  Vaccines are usually given at various ages, according to a schedule. Your health care provider will recommend vaccines for you based on your age, medical history, and lifestyle or other factors, such as travel or where you work. What tests do I need? Screening Your health care provider may recommend screening tests for certain conditions. This may include: Lipid and cholesterol levels. Diabetes screening. This is done by checking your blood sugar (glucose) after you have not eaten for a while (fasting). Hepatitis B test. Hepatitis C test. HIV (human immunodeficiency virus) test. STI (sexually transmitted infection) testing, if you are at risk. Talk with your health care provider about your test results, treatment options, and if necessary, the need for more tests. Follow these instructions  at home: Eating and drinking  Eat a healthy diet that includes fresh fruits and vegetables, whole grains, lean protein, and low-fat dairy products. Drink enough fluid to keep your urine pale yellow. Take vitamin and mineral supplements as recommended by your health care provider. Do not drink alcohol if your health care provider tells you not to drink. If you drink alcohol: Limit how much you  have to 0-2 drinks a day. Know how much alcohol is in your drink. In the [place] one drink equals one 12 oz bottle of beer (355 mL), one 5 oz glass of wine (148 mL), or one 1 oz glass of hard liquor (44 mL). Lifestyle Brush your teeth every morning and night with fluoride toothpaste. Floss one time each day. Exercise for at least 30 minutes 5 or more days each week. Do not use any products that contain nicotine or tobacco. These products include cigarettes, chewing tobacco, and vaping devices, such as e-cigarettes. If you need help quitting, ask your health care provider. Do not use drugs. If you are sexually active, practice safe sex. Use a condom or other form of protection to prevent STIs. Find healthy ways to manage stress, such as: Meditation, yoga, or listening to music. Journaling. Talking to a trusted person. Spending time with friends and family. Minimize exposure to UV radiation to reduce your risk of skin cancer. Safety Always wear your seat belt while driving or riding in a vehicle. Do not drive: If you have been drinking alcohol. Do not ride with someone who has been drinking. If you have been using any mind-altering substances or drugs. While texting. When you are tired or distracted. Wear a helmet and other protective equipment during sports activities. If you have firearms in your house, make sure you follow all gun safety procedures. Seek help if you have been physically or sexually abused. What's next? Go to your health care provider once a year for an annual wellness visit. Ask your health care provider how often you should have your eyes and teeth checked. Stay up to date on all vaccines. This information is not intended to replace advice given to you by your health care provider. Make sure you discuss any questions you have with your health care provider. Document Revised: 11/01/2020 Document Reviewed: 11/01/2020 Elsevier Patient Education  [place].

## 2022-06-21 NOTE — Progress Notes (Signed)
Chief Complaint:  Steven Vaughan is a 37 y.o. male who presents today for his annual comprehensive physical exam.    Assessment/Plan:  New/Acute Problems: Cough URI is resolving.  No red flags.  He can continue over-the-counter meds.  Will let us know if this does not continue to improve.  Chronic Problems Addressed Today: Obesity Patient down 30 pounds since last visit.  He has been off of phentermine for the past month or so.  Diastolic medication has helped with his appetite and ability to lose weight.  He would like to lose another 15 or 20 pounds.  We can send another prescription in for phentermine 37.5 mg daily.  Discussed with patient this should not be a long-term prescription.  He is agreeable to this.  He will follow-up with me in a few months.  Database reviewed without red flags.  Hypertriglyceridemia Check lipids.  Preventative Healthcare: Check labs.   Patient Counseling(The following topics were reviewed and/or handout was given):  -Nutrition: Stressed importance of moderation in sodium/caffeine intake, saturated fat and cholesterol, caloric balance, sufficient intake of fresh fruits, vegetables, and fiber.  -Stressed the importance of regular exercise.   -Substance Abuse: Discussed cessation/primary prevention of tobacco, alcohol, or other drug use; driving or other dangerous activities under the influence; availability of treatment for abuse.   -Injury prevention: Discussed safety belts, safety helmets, smoke detector, smoking near bedding or upholstery.   -Sexuality: Discussed sexually transmitted diseases, partner selection, use of condoms, avoidance of unintended pregnancy and contraceptive alternatives.   -Dental health: Discussed importance of regular tooth brushing, flossing, and dental visits.  -Health maintenance and immunizations reviewed. Please refer to Health maintenance section.  Return to care in 1 year for next preventative visit.     Subjective:   HPI:  He has no acute complaints today.  See A/P for status of chronic conditions.  He went to urgent care few weeks ago with URI.  Diagnosed with sinusitis.  Started on amoxicillin.  Symptoms have improved though still does have some cough.  He has been off of phentermine for the last month or so.  He feels like this has been working well for him.  No significant side effects.  He does feel like it has helped with managing his appetite.  He is down about 30 pounds over the past year or so.  Lifestyle Diet: None specific.  Exercise: None specific. Getting plenty of steps at work.      06/21/2022    7:55 AM  Depression screen PHQ 2/9  Decreased Interest 0  Down, Depressed, Hopeless 0  PHQ - 2 Score 0   ROS: Per HPI, otherwise a complete review of systems was negative.   PMH:  The following were reviewed and entered/updated in epic: Past Medical History:  Diagnosis Date   Chicken pox    Patient Active Problem List   Diagnosis Date Noted   Obesity 06/18/2021   Ganglion cyst of flexor tendon sheath of finger of right hand 06/16/2020   Hypertriglyceridemia 10/13/2018   Left knee pain 07/17/2017   History reviewed. No pertinent surgical history.  Family History  Problem Relation Age of Onset   Gout Neg Hx     Medications- reviewed and updated Current Outpatient Medications  Medication Sig Dispense Refill   phentermine (ADIPEX-P) 37.5 MG tablet Take 1 tablet (37.5 mg total) by mouth daily before breakfast. 30 tablet 2   No current facility-administered medications for this visit.    Allergies-reviewed and updated  No Known Allergies  Social History   Socioeconomic History   Marital status: Married    Spouse name: Not on file   Number of children: 3   Years of education: Not on file   Highest education level: Not on file  Occupational History   Not on file  Tobacco Use   Smoking status: Never   Smokeless tobacco: Never  Vaping Use   Vaping Use: Never used   Substance and Sexual Activity   Alcohol use: No   Drug use: No   Sexual activity: Yes    Partners: Female  Other Topics Concern   Not on file  Social History Narrative   Not on file   Social Determinants of Health   Financial Resource Strain: Not on file  Food Insecurity: Not on file  Transportation Needs: Not on file  Physical Activity: Not on file  Stress: Not on file  Social Connections: Not on file        Objective:  Physical Exam: BP 116/76   Pulse 68   Temp (!) 97.5 F (36.4 C) (Temporal)   Ht 5\' 8"  (1.727 m)   Wt 185 lb 6.4 oz (84.1 kg)   SpO2 98%   BMI 28.19 kg/m   Body mass index is 28.19 kg/m. Wt Readings from Last 3 Encounters:  06/21/22 185 lb 6.4 oz (84.1 kg)  12/13/21 206 lb (93.4 kg)  11/22/21 208 lb (94.3 kg)   Gen: NAD, resting comfortably HEENT: TMs normal bilaterally. OP clear. No thyromegaly noted.  CV: RRR with no murmurs appreciated Pulm: NWOB, CTAB with no crackles, wheezes, or rhonchi GI: Normal bowel sounds present. Soft, Nontender, Nondistended. MSK: no edema, cyanosis, or clubbing noted Skin: warm, dry Neuro: CN2-12 grossly intact. Strength 5/5 in upper and lower extremities. Reflexes symmetric and intact bilaterally.  Psych: Normal affect and thought content     Steven Vaughan M. Jerline Pain, MD 06/21/2022 8:20 AM

## 2022-06-21 NOTE — Assessment & Plan Note (Signed)
Patient down 30 pounds since last visit.  He has been off of phentermine for the past month or so.  Diastolic medication has helped with his appetite and ability to lose weight.  He would like to lose another 15 or 20 pounds.  We can send another prescription in for phentermine 37.5 mg daily.  Discussed with patient this should not be a long-term prescription.  He is agreeable to this.  He will follow-up with me in a few months.  Database reviewed without red flags.

## 2022-06-23 LAB — HEPATITIS C ANTIBODY: Hepatitis C Ab: NONREACTIVE

## 2022-06-25 NOTE — Progress Notes (Signed)
Please inform patient of the following:  Great news! Labs are all normal. Do not need to make any changes to his treatment plan at this time. He should keep up the good work and we can recheck everything in a year or so.  Steven Vaughan. Jerline Pain, MD 06/25/2022 7:41 AM

## 2022-10-12 ENCOUNTER — Ambulatory Visit (HOSPITAL_COMMUNITY): Payer: Managed Care, Other (non HMO)

## 2022-10-12 ENCOUNTER — Telehealth: Payer: Managed Care, Other (non HMO)

## 2023-01-27 ENCOUNTER — Other Ambulatory Visit: Payer: Self-pay | Admitting: Family Medicine

## 2023-01-30 ENCOUNTER — Telehealth: Payer: Managed Care, Other (non HMO) | Admitting: Family Medicine

## 2023-01-30 VITALS — Ht 68.0 in | Wt 185.0 lb

## 2023-01-30 DIAGNOSIS — Z6832 Body mass index (BMI) 32.0-32.9, adult: Secondary | ICD-10-CM | POA: Diagnosis not present

## 2023-01-30 DIAGNOSIS — E669 Obesity, unspecified: Secondary | ICD-10-CM | POA: Diagnosis not present

## 2023-01-30 DIAGNOSIS — E781 Pure hyperglyceridemia: Secondary | ICD-10-CM | POA: Diagnosis not present

## 2023-01-30 MED ORDER — TRIAMCINOLONE ACETONIDE 0.5 % EX OINT: 1.0000 | TOPICAL_OINTMENT | Freq: Two times a day (BID) | CUTANEOUS | 0 refills | Status: DC

## 2023-01-30 MED ORDER — PHENTERMINE HCL 37.5 MG PO TABS: 37.5000 mg | ORAL_TABLET | Freq: Every day | ORAL | 2 refills | Status: DC

## 2023-01-30 NOTE — Assessment & Plan Note (Signed)
Check lipids when he comes back in for Comprehensive Physical Exam (CPE) preventive care annual visit in 6 months.  Should improve with weight loss.

## 2023-01-30 NOTE — Assessment & Plan Note (Signed)
No change in weight over the last several months.  Would like to restart phentermine.  We discussed potential side effects that he is already aware of including cardiovascular side effects, mood changes, and sleep difficulty.  Will restart at previous dose 37.5 mg daily.  Previously was doing well with this.  Will follow-up with [place] in a few months.  Also discussed that he check with insurance about coverage for Qsymia which may be a better long-term option. Previously could not tolerate GLP-1 agonist due to GI side effects.

## 2023-01-30 NOTE — Progress Notes (Signed)
   Steven Vaughan is a 37 y.o. male who presents today for a virtual office visit.  Assessment/Plan:  New/Acute Problems: Contact Dermatitis  No red flags.  Start topical triamcinolone.  Advised that he may notice new lesions popping up over the next several days however he can use triamcinolone there as well.  He will let us know if not hurting.  We discussed reasons return to care.  Chronic Problems Addressed Today: Obesity No change in weight over the last several months.  Would like to restart phentermine.  We discussed potential side effects that he is already aware of including cardiovascular side effects, mood changes, and sleep difficulty.  Will restart at previous dose 37.5 mg daily.  Previously was doing well with this.  Will follow-up with [place] in a few months.  Also discussed that he check with insurance about coverage for Qsymia which may be a better long-term option. Previously could not tolerate GLP-1 agonist due to GI side effects.  Hypertriglyceridemia Check lipids when he comes back in for Comprehensive Physical Exam (CPE) preventive care annual visit in 6 months.  Should improve with weight loss.     Subjective:  HPI:  See Assessment / plan for status of chronic conditions. He is here today for phentermine refill. He has been off this for the last several months would like to restart.  Previously tolerated well.  Help with managing appetite.  No significant side effects.  No palpitations.  No mood changes.  No insomnia.  Also has had a rash on his left arm for the last several days.  Worried about possible poison ivy.  He was at [facility] distribution center but had no obvious exposures.  No treatments tried.  Very itchy.          Objective/Observations  Physical Exam: Wt Readings from Last 3 Encounters:  01/30/23 185 lb (83.9 kg)  06/21/22 185 lb 6.4 oz (84.1 kg)  12/13/21 206 lb (93.4 kg)    Gen: NAD, resting comfortably Pulm: Normal work of breathing Neuro:  Grossly normal, moves all extremities Skin: Confluent 2 to 3 cm rash on left upper extremity consistent with contact dermatitis. Psych: Normal affect and thought content  Virtual Visit via Video   I connected with Steven Vaughan on 01/30/23 at  2:20 PM EDT by a video enabled telemedicine application and verified that I am speaking with the correct person using two identifiers. The limitations of evaluation and management by telemedicine and the availability of in person appointments were discussed. The patient expressed understanding and agreed to proceed.   Patient location: Home Provider location: [place] [employer] Persons participating in the virtual visit: Myself and Patient     Katina Degree. [name], MD 01/30/2023 2:49 PM

## 2023-09-12 ENCOUNTER — Encounter: Payer: Self-pay | Admitting: Family Medicine

## 2023-09-12 ENCOUNTER — Ambulatory Visit (INDEPENDENT_AMBULATORY_CARE_PROVIDER_SITE_OTHER): Payer: Managed Care, Other (non HMO) | Admitting: Family Medicine

## 2023-09-12 VITALS — BP 115/71 | HR 65 | Temp 98.1°F | Ht 68.0 in | Wt 202.2 lb

## 2023-09-12 DIAGNOSIS — Z6832 Body mass index (BMI) 32.0-32.9, adult: Secondary | ICD-10-CM

## 2023-09-12 DIAGNOSIS — Z131 Encounter for screening for diabetes mellitus: Secondary | ICD-10-CM | POA: Diagnosis not present

## 2023-09-12 DIAGNOSIS — Z13220 Encounter for screening for lipoid disorders: Secondary | ICD-10-CM | POA: Diagnosis not present

## 2023-09-12 DIAGNOSIS — E66811 Obesity, class 1: Secondary | ICD-10-CM | POA: Diagnosis not present

## 2023-09-12 DIAGNOSIS — E781 Pure hyperglyceridemia: Secondary | ICD-10-CM | POA: Diagnosis not present

## 2023-09-12 DIAGNOSIS — Z0001 Encounter for general adult medical examination with abnormal findings: Secondary | ICD-10-CM

## 2023-09-12 LAB — CBC
HCT: 46.2 % (ref 39.0–52.0)
Hemoglobin: 15.4 g/dL (ref 13.0–17.0)
MCHC: 33.3 g/dL (ref 30.0–36.0)
MCV: 90.4 fl (ref 78.0–100.0)
Platelets: 200 10*3/uL (ref 150.0–400.0)
RBC: 5.11 Mil/uL (ref 4.22–5.81)
RDW: 14.1 % (ref 11.5–15.5)
WBC: 6.3 10*3/uL (ref 4.0–10.5)

## 2023-09-12 LAB — COMPREHENSIVE METABOLIC PANEL WITH GFR
ALT: 32 U/L (ref 0–53)
AST: 24 U/L (ref 0–37)
Albumin: 4.4 g/dL (ref 3.5–5.2)
Alkaline Phosphatase: 66 U/L (ref 39–117)
BUN: 14 mg/dL (ref 6–23)
CO2: 30 meq/L (ref 19–32)
Calcium: 9.3 mg/dL (ref 8.4–10.5)
Chloride: 105 meq/L (ref 96–112)
Creatinine, Ser: 1.1 mg/dL (ref 0.40–1.50)
GFR: 84.27 mL/min (ref >60.00)
Glucose, Bld: 93 mg/dL (ref 70–99)
Potassium: 4.2 meq/L (ref 3.5–5.1)
Sodium: 141 meq/L (ref 135–145)
Total Bilirubin: 0.4 mg/dL (ref 0.2–1.2)
Total Protein: 6.8 g/dL (ref 6.0–8.3)

## 2023-09-12 LAB — LIPID PANEL
Cholesterol: 141 mg/dL (ref 0–200)
HDL: 43.2 mg/dL (ref >39.00)
LDL Cholesterol: 80 mg/dL (ref 0–99)
NonHDL: 97.7
Total CHOL/HDL Ratio: 3
Triglycerides: 91 mg/dL (ref 0.0–149.0)
VLDL: 18.2 mg/dL (ref 0.0–40.0)

## 2023-09-12 LAB — TSH: TSH: 1.05 u[IU]/mL (ref 0.35–5.50)

## 2023-09-12 LAB — HEMOGLOBIN A1C: Hgb A1c MFr Bld: 5.4 % (ref 4.6–6.5)

## 2023-09-12 MED ORDER — QSYMIA 3.75-23 MG PO CP24: 1.0000 | ORAL_CAPSULE | Freq: Every day | ORAL | 0 refills | Status: AC

## 2023-09-12 NOTE — Assessment & Plan Note (Signed)
Check lipids.  Discussed lifestyle interventions.

## 2023-09-12 NOTE — Assessment & Plan Note (Signed)
 BMI 30.74 with comorbidities.  He would be a good candidate for GLP agonist however did not tolerate semaglutide  in the past due to GI side effects.  He has done modestly well with phentermine  though most recent round did not feel like gave him much benefit.  He is interested in trying Qsymia.  We discussed potential side effects.  Will start low-dose 3.75-23 once daily.  He will follow-up with us  in a few weeks and can titrate as needed.

## 2023-09-12 NOTE — Progress Notes (Signed)
 Chief Complaint:  Steven Vaughan is a 38 y.o. male who presents today for his annual comprehensive physical exam.    Assessment/Plan:  Chronic Problems Addressed Today: Obesity BMI 30.74 with comorbidities.  He would be a good candidate for GLP agonist however did not tolerate semaglutide  in the past due to GI side effects.  He has done modestly well with phentermine  though most recent round did not feel like gave him much benefit.  He is interested in trying Qsymia .  We discussed potential side effects.  Will start low-dose 3.75-23 once daily.  He will follow-up with us  in a few weeks and can titrate as needed.  Hypertriglyceridemia Check lipids.  Discussed lifestyle interventions.  Preventative Healthcare: Check labs.  Up-to-date on other screenings. .  Patient Counseling(The following topics were reviewed and/or handout was given):  -Nutrition: Stressed importance of moderation in sodium/caffeine intake, saturated fat and cholesterol, caloric balance, sufficient intake of fresh fruits, vegetables, and fiber.  -Stressed the importance of regular exercise.   -Substance Abuse: Discussed cessation/primary prevention of tobacco, alcohol, or other drug use; driving or other dangerous activities under the influence; availability of treatment for abuse.   -Injury prevention: Discussed safety belts, safety helmets, smoke detector, smoking near bedding or upholstery.   -Sexuality: Discussed sexually transmitted diseases, partner selection, use of condoms, avoidance of unintended pregnancy and contraceptive alternatives.   -Dental health: Discussed importance of regular tooth brushing, flossing, and dental visits.  -Health maintenance and immunizations reviewed. Please refer to Health maintenance section.  Return to care in 1 year for next preventative visit.     Subjective:  HPI:  He has no acute complaints today. See Assessment / plan for status of chronic conditions.  I saw him a few  months ago.  He has been taking phentermine  intermittently.  We last prescribed this about 6 months ago.  Did not have much benefit in this most recent course.  He is interested in potentially trying Qsymia .  We discussed this previously however insurance would not pay for it.  Lifestyle Diet: Balanced. Trying to get more fruits and vegetables.  Exercise: Getting plenty of steps in.      09/12/2023    9:12 AM  Depression screen PHQ 2/9  Decreased Interest 0  Down, Depressed, Hopeless 0  PHQ - 2 Score 0   There are no preventive care reminders to display for this patient.   ROS: Per HPI, otherwise a complete review of systems was negative.   PMH:  The following were reviewed and entered/updated in epic: Past Medical History:  Diagnosis Date   Chicken pox    Patient Active Problem List   Diagnosis Date Noted   Obesity 06/18/2021   Ganglion cyst of flexor tendon sheath of finger of right hand 06/16/2020   Hypertriglyceridemia 10/13/2018   Left knee pain 07/17/2017   History reviewed. No pertinent surgical history.  Family History  Problem Relation Age of Onset   Gout Neg Hx     Medications- reviewed and updated Current Outpatient Medications  Medication Sig Dispense Refill   Phentermine -Topiramate (QSYMIA ) 3.75-23 MG CP24 Take 1 capsule by mouth daily. 30 capsule 0   No current facility-administered medications for this visit.    Allergies-reviewed and updated No Known Allergies  Social History   Socioeconomic History   Marital status: Married    Spouse name: Not on file   Number of children: 3   Years of education: Not on file   Highest education level: Not  on file  Occupational History   Not on file  Tobacco Use   Smoking status: Never   Smokeless tobacco: Never  Vaping Use   Vaping status: Never Used  Substance and Sexual Activity   Alcohol use: No   Drug use: No   Sexual activity: Yes    Partners: Female  Other Topics Concern   Not on file   Social History Narrative   Not on file   Social Drivers of Health   Financial Resource Strain: Not on file  Food Insecurity: Not on file  Transportation Needs: Not on file  Physical Activity: Not on file  Stress: Not on file  Social Connections: Not on file        Objective:  Physical Exam: BP 115/71   Pulse 65   Temp 98.1 F (36.7 C) (Temporal)   Ht 5\' 8"  (1.727 m)   Wt 202 lb 3.2 oz (91.7 kg)   SpO2 100%   BMI 30.74 kg/m   Body mass index is 30.74 kg/m. Wt Readings from Last 3 Encounters:  09/12/23 202 lb 3.2 oz (91.7 kg)  01/30/23 185 lb (83.9 kg)  06/21/22 185 lb 6.4 oz (84.1 kg)   Gen: NAD, resting comfortably HEENT: TMs normal bilaterally. OP clear. No thyromegaly noted.  CV: RRR with no murmurs appreciated Pulm: NWOB, CTAB with no crackles, wheezes, or rhonchi GI: Normal bowel sounds present. Soft, Nontender, Nondistended. MSK: no edema, cyanosis, or clubbing noted Skin: warm, dry Neuro: CN2-12 grossly intact. Strength 5/5 in upper and lower extremities. Reflexes symmetric and intact bilaterally.  Psych: Normal affect and thought content     Leveda Kendrix M. Daneil Dunker, MD 09/12/2023 10:13 AM

## 2023-09-12 NOTE — Patient Instructions (Signed)
 It was very nice to see you today!  We will check blood work.  Please try the Qsymia .  Let me know in a few weeks how this is working for you.  Please continue to work on diet and exercise.  I will see back in year for your next physical.  Come back sooner if needed.  Return in about 1 year (around 09/11/2024) for Annual Physical.   Take care, Dr Daneil Dunker  PLEASE NOTE:  If you had any lab tests, please let us  know if you have not heard back within a few days. You may see your results on mychart before we have a chance to review them but we will give you a call once they are reviewed by us .   If we ordered any referrals today, please let us  know if you have not heard from their office within the next week.   If you had any urgent prescriptions sent in today, please check with the pharmacy within an hour of our visit to make sure the prescription was transmitted appropriately.   Please try these tips to maintain a healthy lifestyle:  Eat at least 3 REAL meals and 1-2 snacks per day.  Aim for no more than 5 hours between eating.  If you eat breakfast, please do so within one hour of getting up.   Each meal should contain half fruits/vegetables, one quarter protein, and one quarter carbs (no bigger than a computer mouse)  Cut down on sweet beverages. This includes juice, soda, and sweet tea.   Drink at least 1 glass of water with each meal and aim for at least 8 glasses per day  Exercise at least 150 minutes every week.    Preventive Care 6-12 Years Old, Male Preventive care refers to lifestyle choices and visits with your health care provider that can promote health and wellness. Preventive care visits are also called wellness exams. What can I expect for my preventive care visit? Counseling During your preventive care visit, your health care provider may ask about your: Medical history, including: Past medical problems. Family medical history. Current health,  including: Emotional well-being. Home life and relationship well-being. Sexual activity. Lifestyle, including: Alcohol, nicotine or tobacco, and drug use. Access to firearms. Diet, exercise, and sleep habits. Safety issues such as seatbelt and bike helmet use. Sunscreen use. Work and work Astronomer. Physical exam Your health care provider will check your: Height and weight. These may be used to calculate your BMI (body mass index). BMI is a measurement that tells if you are at a healthy weight. Waist circumference. This measures the distance around your waistline. This measurement also tells if you are at a healthy weight and may help predict your risk of certain diseases, such as type 2 diabetes and high blood pressure. Heart rate and blood pressure. Body temperature. Skin for abnormal spots. What immunizations do I need?  Vaccines are usually given at various ages, according to a schedule. Your health care provider will recommend vaccines for you based on your age, medical history, and lifestyle or other factors, such as travel or where you work. What tests do I need? Screening Your health care provider may recommend screening tests for certain conditions. This may include: Lipid and cholesterol levels. Diabetes screening. This is done by checking your blood sugar (glucose) after you have not eaten for a while (fasting). Hepatitis B test. Hepatitis C test. HIV (human immunodeficiency virus) test. STI (sexually transmitted infection) testing, if you are at risk. Lung  cancer screening. Prostate cancer screening. Colorectal cancer screening. Talk with your health care provider about your test results, treatment options, and if necessary, the need for more tests. Follow these instructions at home: Eating and drinking  Eat a diet that includes fresh fruits and vegetables, whole grains, lean protein, and low-fat dairy products. Take vitamin and mineral supplements as recommended  by your health care provider. Do not drink alcohol if your health care provider tells you not to drink. If you drink alcohol: Limit how much you have to 0-2 drinks a day. Know how much alcohol is in your drink. In the [place] one drink equals one 12 oz bottle of beer (355 mL), one 5 oz glass of wine (148 mL), or one 1 oz glass of hard liquor (44 mL). Lifestyle Brush your teeth every morning and night with fluoride toothpaste. Floss one time each day. Exercise for at least 30 minutes 5 or more days each week. Do not use any products that contain nicotine or tobacco. These products include cigarettes, chewing tobacco, and vaping devices, such as e-cigarettes. If you need help quitting, ask your health care provider. Do not use drugs. If you are sexually active, practice safe sex. Use a condom or other form of protection to prevent STIs. Take aspirin only as told by your health care provider. Make sure that you understand how much to take and what form to take. Work with your health care provider to find out whether it is safe and beneficial for you to take aspirin daily. Find healthy ways to manage stress, such as: Meditation, yoga, or listening to music. Journaling. Talking to a trusted person. Spending time with friends and family. Minimize exposure to UV radiation to reduce your risk of skin cancer. Safety Always wear your seat belt while driving or riding in a vehicle. Do not drive: If you have been drinking alcohol. Do not ride with someone who has been drinking. When you are tired or distracted. While texting. If you have been using any mind-altering substances or drugs. Wear a helmet and other protective equipment during sports activities. If you have firearms in your house, make sure you follow all gun safety procedures. What's next? Go to your health care provider once a year for an annual wellness visit. Ask your health care provider how often you should have your eyes and teeth  checked. Stay up to date on all vaccines. This information is not intended to replace advice given to you by your health care provider. Make sure you discuss any questions you have with your health care provider. Document Revised: 11/01/2020 Document Reviewed: 11/01/2020 Elsevier Patient Education  2024 [employer].

## 2023-09-15 ENCOUNTER — Encounter: Payer: Self-pay | Admitting: Family Medicine

## 2023-09-15 NOTE — Progress Notes (Signed)
 Great news! Labs are all at goal. We can recheck again in a year.

## 2023-12-23 ENCOUNTER — Other Ambulatory Visit: Payer: Self-pay | Admitting: Medical Genetics

## 2024-01-06 ENCOUNTER — Other Ambulatory Visit (HOSPITAL_COMMUNITY)
Admission: RE | Admit: 2024-01-06 | Discharge: 2024-01-06 | Disposition: A | Discharge status: Home / Self Care | Payer: Self-pay | Source: Ambulatory Visit | Attending: Oncology | Admitting: Oncology

## 2024-01-13 LAB — GENECONNECT MOLECULAR SCREEN: Genetic Analysis Overall Interpretation: NEGATIVE

## 2024-01-15 ENCOUNTER — Encounter: Payer: Self-pay | Admitting: Family Medicine

## 2024-01-15 ENCOUNTER — Ambulatory Visit: Payer: Self-pay

## 2024-01-15 ENCOUNTER — Ambulatory Visit (INDEPENDENT_AMBULATORY_CARE_PROVIDER_SITE_OTHER): Admitting: Family Medicine

## 2024-01-15 VITALS — BP 120/78 | HR 106 | Temp 98.4°F | Ht 68.0 in | Wt 207.4 lb

## 2024-01-15 DIAGNOSIS — R059 Cough, unspecified: Secondary | ICD-10-CM

## 2024-01-15 LAB — POC COVID19 BINAXNOW: SARS Coronavirus 2 Ag: POSITIVE — AB

## 2024-01-15 MED ORDER — PROMETHAZINE-DM 6.25-15 MG/5ML PO SYRP: 5.0000 mL | ORAL_SOLUTION | Freq: Four times a day (QID) | ORAL | 0 refills | Status: AC | PRN

## 2024-01-15 NOTE — Telephone Encounter (Signed)
 FYI Only or Action Required?: FYI only for provider.  Patient was last seen in primary care on 09/12/2023 by Kennyth Worth HERO, MD.  Called Nurse Triage reporting Cough.  Symptoms began yesterday.  Interventions attempted: OTC medications: IBU.  Symptoms are: gradually worsening. Sore throat, BA, LGF and ear pain,   Triage Disposition: Home Care- pt wants to be seen  Patient/caregiver understands and will follow disposition?: No, refuses disposition - wants to be seen in office                 Copied from CRM #[id number]. Topic: Clinical - Red Word Triage >> Jan 15, 2024  8:22 AM Pinkey ORN wrote: Red Word that prompted transfer to Nurse Triage: Fever >> Jan 15, 2024  8:22 AM Pinkey ORN wrote: Patient is experiencing fever, bodyaches as well as a cough.  Reason for Disposition  Cough with cold symptoms (e.g., runny nose, postnasal drip, throat clearing)  Answer Assessment - Initial Assessment Questions 1. ONSET: When did the cough begin?      Yesterday 2. SEVERITY: How bad is the cough today?      Not that often - 15 to 20 minutes 3. SPUTUM: Describe the color of your sputum (e.g., none, dry cough; clear, white, yellow, green)     none 4. HEMOPTYSIS: Are you coughing up any blood? If Yes, ask: How much? (e.g., flecks, streaks, tablespoons, etc.)     no 5. DIFFICULTY BREATHING: Are you having difficulty breathing? If Yes, ask: How bad is it? (e.g., mild, moderate, severe)      no 6. FEVER: Do you have a fever? If Yes, ask: What is your temperature, how was it measured, and when did it start?     101 prior to  IBU 7. CARDIAC HISTORY: Do you have any history of heart disease? (e.g., heart attack, congestive heart failure)      no 8. LUNG HISTORY: Do you have any history of lung disease?  (e.g., pulmonary embolus, asthma, emphysema)     no  10. OTHER SYMPTOMS: Do you have any other symptoms? (e.g., runny nose, wheezing, chest pain)        Throat hurts,  Ear pain  Protocols used: Cough - Acute Non-Productive-A-AH

## 2024-01-15 NOTE — Telephone Encounter (Signed)
 Appt today

## 2024-01-15 NOTE — Progress Notes (Signed)
   Steven Vaughan is a 38 y.o. male who presents today for an office visit.  Assessment/Plan:  COVID Rapid COVID-positive.  No red flags.  Overall reassuring exam.  Anticipate that he will recover from this without any significant complications.  It is okay for him to use over-the-counter meds as needed.  We did discuss starting Paxlovid however hold off on this for now given overall mild symptoms and lack of other significant risk factors.   Will give prescription promethazine  dextromethorphan cough syrup to use as needed.  Encourage hydration.  We discussed reasons to return to care.  Follow-up as needed.    Subjective:  HPI:  See assessment / plan for status of chronic conditions.    Discussed the use of AI scribe software for clinical note transcription with the patient, who gave verbal consent to proceed.  History of Present Illness Steven Vaughan is a 38 year old male who presents with cough, congestion, and earache.  He has been experiencing cough and congestion for the past couple of days, which initially started with these symptoms and later developed into an earache and throat discomfort. The patient reports a sharp pain in the right ear.  He has been experiencing body aches and has been staying home due to his symptoms. He has not been around anyone else who is sick. He has not taken ibuprofen or Tylenol today, but has used them previously with some relief. He feels that his symptoms are worsening compared to yesterday.  He is currently taking Cimduo at home. He has not reported any fever, but he has experienced body aches. He has not used any other medications for his symptoms today.  He reports cough, congestion, earache, and throat irritation. No exposure to others who are sick.         Objective:  Physical Exam: BP 120/78   Pulse (!) 106   Temp 98.4 F (36.9 C) (Temporal)   Ht 5' 8 (1.727 m)   Wt 207 lb 6.4 oz (94.1 kg)   SpO2 97%   BMI 31.54 kg/m   Gen: No  acute distress, resting comfortably HEENT: TMs with clear effusion.  OP erythematous. CV: Regular rate and rhythm with no murmurs appreciated Pulm: Normal work of breathing, clear to auscultation bilaterally with no crackles, wheezes, or rhonchi Neuro: Grossly normal, moves all extremities Psych: Normal affect and thought content      Ramonita Koenig M. Kennyth, MD 01/15/2024 1:27 PM

## 2024-01-15 NOTE — Patient Instructions (Signed)
 It was very nice to see you today!  VISIT SUMMARY: You came in today with symptoms of cough, congestion, earache, and throat discomfort. You tested positive for COVID-19, and your symptoms are expected to resolve in 5-7 days with symptomatic treatment.  YOUR PLAN: COVID-19 INFECTION: You tested positive for COVID-19 with mild symptoms. -Your symptoms are expected to resolve in 5-7 days. -Treat your symptoms with fluids, ibuprofen, acetaminophen, DayQuil, and NyQuil. -Monitor for worsening symptoms such as increased pain or fever, which may indicate a secondary bacterial infection. -Contact us  if your symptoms worsen or if you have concerns about a secondary infection.  ACUTE COUGH: Your cough is due to COVID-19 and should improve as the infection resolves. -You have been prescribed cough syrup to manage your symptoms. -Be cautious with the cough syrup as it may cause drowsiness.  Return if symptoms worsen or fail to improve.   Take care, Dr Kennyth  PLEASE NOTE:  If you had any lab tests, please let us  know if you have not heard back within a few days. You may see your results on mychart before we have a chance to review them but we will give you a call once they are reviewed by us .   If we ordered any referrals today, please let us  know if you have not heard from their office within the next week.   If you had any urgent prescriptions sent in today, please check with the pharmacy within an hour of our visit to make sure the prescription was transmitted appropriately.   Please try these tips to maintain a healthy lifestyle:  Eat at least 3 REAL meals and 1-2 snacks per day.  Aim for no more than 5 hours between eating.  If you eat breakfast, please do so within one hour of getting up.   Each meal should contain half fruits/vegetables, one quarter protein, and one quarter carbs (no bigger than a computer mouse)  Cut down on sweet beverages. This includes juice, soda, and sweet tea.    Drink at least 1 glass of water with each meal and aim for at least 8 glasses per day  Exercise at least 150 minutes every week.

## 2024-04-25 ENCOUNTER — Other Ambulatory Visit: Payer: Self-pay | Admitting: Family Medicine
